# Patient Record
Sex: Male | Born: 1955 | Race: White | Hispanic: No | Marital: Married | Smoking: Never smoker
Health system: Southern US, Community
[De-identification: ages and names within clinical notes are randomized; demographics above are authoritative.]

## PROBLEM LIST (undated history)

## (undated) DIAGNOSIS — I10 Essential (primary) hypertension: Secondary | ICD-10-CM

## (undated) DIAGNOSIS — K219 Gastro-esophageal reflux disease without esophagitis: Secondary | ICD-10-CM

## (undated) DIAGNOSIS — E785 Hyperlipidemia, unspecified: Secondary | ICD-10-CM

## (undated) HISTORY — PX: NO PAST SURGERIES: SHX2092

## (undated) HISTORY — DX: Hyperlipidemia, unspecified: E78.5

## (undated) HISTORY — DX: Gastro-esophageal reflux disease without esophagitis: K21.9

## (undated) HISTORY — DX: Essential (primary) hypertension: I10

---

## 2014-02-15 ENCOUNTER — Ambulatory Visit: Payer: Self-pay | Admitting: Emergency Medicine

## 2014-08-02 ENCOUNTER — Encounter: Payer: Self-pay | Admitting: Family Medicine

## 2014-08-02 DIAGNOSIS — I10 Essential (primary) hypertension: Secondary | ICD-10-CM | POA: Insufficient documentation

## 2014-08-02 DIAGNOSIS — E669 Obesity, unspecified: Secondary | ICD-10-CM | POA: Insufficient documentation

## 2014-08-02 DIAGNOSIS — K219 Gastro-esophageal reflux disease without esophagitis: Secondary | ICD-10-CM | POA: Insufficient documentation

## 2014-12-10 ENCOUNTER — Encounter: Payer: Self-pay | Admitting: Family Medicine

## 2014-12-10 ENCOUNTER — Ambulatory Visit (INDEPENDENT_AMBULATORY_CARE_PROVIDER_SITE_OTHER): Payer: Self-pay | Admitting: Family Medicine

## 2014-12-10 VITALS — BP 120/72 | HR 60 | Ht 70.0 in | Wt 216.6 lb

## 2014-12-10 DIAGNOSIS — K219 Gastro-esophageal reflux disease without esophagitis: Secondary | ICD-10-CM

## 2014-12-10 DIAGNOSIS — E669 Obesity, unspecified: Secondary | ICD-10-CM

## 2014-12-10 DIAGNOSIS — I10 Essential (primary) hypertension: Secondary | ICD-10-CM

## 2014-12-10 NOTE — Progress Notes (Signed)
Date:  12/10/2014   Name:  Adam Coffey   DOB:  1955-09-17   MRN:  [id number]  PCP:  Schuyler Amor, MD    Chief Complaint: Hypertension   History of Present Illness:  This is a 59 y.o. male for f/u HTN, tolerating amlodipine well, no se's noted. Taking Nexium daily for GERD, has sxs when stops but also not watching what he eats on the weekend. Biking now and has lost some weight. Had tetanus imm last visit and not interested in colonoscopy.  Review of Systems:  Review of Systems  Patient Active Problem List   Diagnosis Date Noted  . Gastroesophageal reflux disease 08/02/2014  . Benign essential HTN 08/02/2014  . Obesity (BMI 30.0-34.9) 08/02/2014    Prior to Admission medications   Medication Sig Start Date End Date Taking? Authorizing Provider  amLODipine (NORVASC) 10 MG tablet Take 1 tablet by mouth daily. 05/28/14  Yes Historical Provider, MD  esomeprazole (NEXIUM) 20 MG capsule Take 1 tablet by mouth daily. OTC   Yes Historical Provider, MD    No Known Allergies  No past surgical history on file.  Social History  Substance Use Topics  . Smoking status: Never Smoker   . Smokeless tobacco: Not on file  . Alcohol Use: No    No family history on file.  Medication list has been reviewed and updated.  Physical Examination: BP 120/72 mmHg  Pulse 60  Ht  (1.778 m)  Wt 216 lb 9.6 oz (98.249 kg)  BMI 31.08 kg/m2  Physical Exam  Constitutional: He appears well-developed and well-nourished.  Cardiovascular: Normal rate, regular rhythm and normal heart sounds.   Pulmonary/Chest: Effort normal and breath sounds normal.  Musculoskeletal: He exhibits no edema.    Assessment and Plan:  1. Benign essential HTN Well controlled on current regimen, discussed decreasing amlodipine dose, wants to wait until after holidays  2. Gastroesophageal reflux disease, esophagitis presence not specified Well controlled on daily Nexium, discussed decreasing or stopping  to avoid LT se's, will try just using on the weekends  3. Obesity (BMI 30.0-34.9) Continue increased exercise, weight loss  Return in about 6 months (around 06/12/2015).  [name]. Kingsley Spittle MD [facility]  12/10/2014

## 2015-06-12 ENCOUNTER — Ambulatory Visit: Payer: Self-pay | Admitting: Family Medicine

## 2015-06-13 ENCOUNTER — Encounter: Payer: Self-pay | Admitting: Family Medicine

## 2015-06-13 ENCOUNTER — Ambulatory Visit (INDEPENDENT_AMBULATORY_CARE_PROVIDER_SITE_OTHER): Payer: Self-pay | Admitting: Family Medicine

## 2015-06-13 VITALS — BP 125/85 | HR 77 | Ht 70.0 in | Wt 224.0 lb

## 2015-06-13 DIAGNOSIS — E66811 Obesity, class 1: Secondary | ICD-10-CM

## 2015-06-13 DIAGNOSIS — I10 Essential (primary) hypertension: Secondary | ICD-10-CM

## 2015-06-13 DIAGNOSIS — K219 Gastro-esophageal reflux disease without esophagitis: Secondary | ICD-10-CM

## 2015-06-13 DIAGNOSIS — Z0000 Encounter for general adult medical examination without abnormal findings: Secondary | ICD-10-CM

## 2015-06-13 DIAGNOSIS — E669 Obesity, unspecified: Secondary | ICD-10-CM

## 2015-06-13 DIAGNOSIS — Z8249 Family history of ischemic heart disease and other diseases of the circulatory system: Secondary | ICD-10-CM

## 2015-06-13 MED ORDER — AMLODIPINE BESYLATE 5 MG PO TABS: 5.0000 mg | ORAL_TABLET | Freq: Every day | ORAL | Status: DC

## 2015-06-13 NOTE — Progress Notes (Signed)
Date:  06/13/2015   Name:  Adam Coffey   DOB:  1956-03-12   MRN:  [id number]  PCP:  Schuyler Amor, MD    Chief Complaint: Follow-up and Hypertension   History of Present Illness:  This is a 60 y.o. male for 6 month f/u. Continues to feel well though weight increased over holidays. Unable to wean off Nexium without sign sx recurrence so back to taking daily with good sx control. Exercising daily and interested in lowering amlodipine dose.   Review of Systems:  Review of Systems  Constitutional: Negative for unexpected weight change.  Respiratory: Negative for shortness of breath.   Cardiovascular: Negative for palpitations and leg swelling.  Endocrine: Negative for polyuria.  Genitourinary: Negative for difficulty urinating.  Neurological: Negative for syncope and light-headedness.    Patient Active Problem List   Diagnosis Date Noted  . Gastroesophageal reflux disease 08/02/2014  . Benign essential HTN 08/02/2014  . Obesity (BMI 30.0-34.9) 08/02/2014    Prior to Admission medications   Medication Sig Start Date End Date Taking? Authorizing Provider  amLODipine (NORVASC) 5 MG tablet Take 1 tablet (5 mg total) by mouth daily. 06/13/15  Yes Schuyler Amor, MD  esomeprazole (NEXIUM) 20 MG capsule Take 1 tablet by mouth daily. OTC   Yes Historical Provider, MD    No Known Allergies  Past Surgical History  Procedure Laterality Date  . No past surgeries      Social History  Substance Use Topics  . Smoking status: Never Smoker   . Smokeless tobacco: None  . Alcohol Use: No    Family History  Problem Relation Age of Onset  . Heart disease Father   . Liver cancer Father     Medication list has been reviewed and updated.  Physical Examination: BP 125/85 mmHg  Pulse 77  Ht  (1.778 m)  Wt 224 lb (101.606 kg)  BMI 32.14 kg/m2  Physical Exam  Constitutional: He appears well-developed and well-nourished.  Cardiovascular: Normal rate, regular rhythm and  normal heart sounds.   Pulmonary/Chest: Effort normal and breath sounds normal.  Musculoskeletal: He exhibits no edema.  Neurological: He is alert.  Skin: Skin is warm and dry.  Psychiatric: He has a normal mood and affect. His behavior is normal.  Nursing note and vitals reviewed.   Assessment and Plan:  1. Benign essential HTN Well controlled past two visits (125/85 and 120/72), decrease amlodipine to 5 mg daily  2. Gastroesophageal reflux disease, esophagitis presence not specified Unable to wean off Nexium, continue daily use  3. Obesity (BMI 30.0-34.9) Discussed exercise/weight loss  4. Health maintenance Consider blood work next visit (none since initial visit 03/2014)  Return in about 6 months (around 12/11/2015).  [name]. Kingsley Spittle MD [facility]  06/13/2015 .mmcp

## 2015-09-16 ENCOUNTER — Other Ambulatory Visit: Payer: Self-pay | Admitting: Family Medicine

## 2015-09-17 ENCOUNTER — Telehealth: Payer: Self-pay

## 2015-09-17 NOTE — Telephone Encounter (Signed)
Amlodipine 5mg  refilled 2/23, 90d with 3R, suspect pharm call was for amlodipine 10mg  which he is no longer taking

## 2015-09-17 NOTE — Telephone Encounter (Signed)
Sent to [facility] 

## 2015-12-13 ENCOUNTER — Ambulatory Visit: Payer: Self-pay | Admitting: Family Medicine

## 2016-03-25 ENCOUNTER — Ambulatory Visit (INDEPENDENT_AMBULATORY_CARE_PROVIDER_SITE_OTHER): Payer: Self-pay | Admitting: Family Medicine

## 2016-03-25 ENCOUNTER — Encounter: Payer: Self-pay | Admitting: Family Medicine

## 2016-03-25 VITALS — BP 142/92 | HR 60 | Temp 97.4°F | Ht 70.0 in | Wt 224.0 lb

## 2016-03-25 DIAGNOSIS — K219 Gastro-esophageal reflux disease without esophagitis: Secondary | ICD-10-CM

## 2016-03-25 DIAGNOSIS — E669 Obesity, unspecified: Secondary | ICD-10-CM

## 2016-03-25 DIAGNOSIS — I10 Essential (primary) hypertension: Secondary | ICD-10-CM

## 2016-03-25 DIAGNOSIS — Z8249 Family history of ischemic heart disease and other diseases of the circulatory system: Secondary | ICD-10-CM

## 2016-03-25 DIAGNOSIS — E66811 Obesity, class 1: Secondary | ICD-10-CM

## 2016-03-26 ENCOUNTER — Other Ambulatory Visit: Payer: Self-pay | Admitting: Family Medicine

## 2016-03-26 LAB — COMPREHENSIVE METABOLIC PANEL
ALBUMIN: 4.5 g/dL (ref 3.6–4.8)
ALT: 18 IU/L (ref 0–44)
AST: 19 IU/L (ref 0–40)
Albumin/Globulin Ratio: 1.5 (ref 1.2–2.2)
Alkaline Phosphatase: 64 IU/L (ref 39–117)
BUN / CREAT RATIO: 17 (ref 10–24)
BUN: 20 mg/dL (ref 8–27)
Bilirubin Total: 0.6 mg/dL (ref 0.0–1.2)
CO2: 25 mmol/L (ref 18–29)
CREATININE: 1.15 mg/dL (ref 0.76–1.27)
Calcium: 9.7 mg/dL (ref 8.6–10.2)
Chloride: 101 mmol/L (ref 96–106)
GFR calc non Af Amer: 69 mL/min/{1.73_m2} (ref >59)
GFR, EST AFRICAN AMERICAN: 80 mL/min/{1.73_m2} (ref >59)
GLUCOSE: 92 mg/dL (ref 65–99)
Globulin, Total: 3 g/dL (ref 1.5–4.5)
Potassium: 4.4 mmol/L (ref 3.5–5.2)
Sodium: 141 mmol/L (ref 134–144)
Total Protein: 7.5 g/dL (ref 6.0–8.5)

## 2016-03-26 LAB — LIPID PANEL
CHOL/HDL RATIO: 7.6 ratio — AB (ref 0.0–5.0)
Cholesterol, Total: 266 mg/dL — ABNORMAL HIGH (ref 100–199)
HDL: 35 mg/dL — AB (ref >39)
Triglycerides: 495 mg/dL — ABNORMAL HIGH (ref 0–149)

## 2016-03-26 LAB — CBC
HEMATOCRIT: 42.4 % (ref 37.5–51.0)
Hemoglobin: 14.8 g/dL (ref 13.0–17.7)
MCH: 28.7 pg (ref 26.6–33.0)
MCHC: 34.9 g/dL (ref 31.5–35.7)
MCV: 82 fL (ref 79–97)
Platelets: 254 10*3/uL (ref 150–379)
RBC: 5.16 x10E6/uL (ref 4.14–5.80)
RDW: 14.4 % (ref 12.3–15.4)
WBC: 7.5 10*3/uL (ref 3.4–10.8)

## 2016-03-26 MED ORDER — ATORVASTATIN CALCIUM 20 MG PO TABS: 20.0000 mg | ORAL_TABLET | Freq: Every day | ORAL | 2 refills | Status: DC

## 2016-03-26 NOTE — Progress Notes (Signed)
Date:  03/25/2016   Name:  Adam Coffey   DOB:  December 21, 1955   MRN:  [id number]  PCP:  Schuyler AmorWilliam Jesyca Weisenburger, MD    Chief Complaint: Hypertension (6 months f/u)   History of Present Illness:  This is a 60 y.o. male for ten month f/u, amlodipine decreased to 5 mg daily last visit but refilled 10 mg daily, tolerating well. Still unable to d/c Nexium due to severe recurrent GERD sxs.. Weight unchanged, exercising daily. 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 Declines flu imm.  Review of Systems:  Review of Systems  Constitutional: Negative for appetite change and fever.  Respiratory: Negative for cough and shortness of breath.   Cardiovascular: Negative for chest pain and leg swelling.  Endocrine: Negative for polyuria.  Genitourinary: Negative for difficulty urinating.  Neurological: Negative for dizziness and syncope.    Patient Active Problem List   Diagnosis Date Noted  . FH: heart disease 06/13/2015  . Gastroesophageal reflux disease 08/02/2014  . Benign essential HTN 08/02/2014  . Obesity (BMI 30.0-34.9) 08/02/2014    Prior to Admission medications   Medication Sig Start Date End Date Taking? Authorizing Provider  amLODipine (NORVASC) 10 MG tablet TAKE ONE TABLET BY MOUTH ONCE DAILY 09/19/15   Schuyler AmorWilliam Moataz Tavis, MD  esomeprazole (NEXIUM) 20 MG capsule Take 1 tablet by mouth daily. OTC    Historical Provider, MD    No Known Allergies  Past Surgical History:  Procedure Laterality Date  . NO PAST SURGERIES      Social History  Substance Use Topics  . Smoking status: Never Smoker  . Smokeless tobacco: Not on file  . Alcohol use No    Family History  Problem Relation Age of Onset  . Heart disease Father   . Liver cancer Father     Medication list has been reviewed and updated.  Physical Examination: BP (!) 142/92   Pulse 60   Temp 97.4 F (36.3 C)   Ht 5\' 10"  (1.778 m)   Wt 224 lb (101.6 kg)   SpO2 98%   BMI 32.14 kg/m   Physical Exam   Constitutional: He appears well-developed and well-nourished.  Cardiovascular: Normal rate, regular rhythm and normal heart sounds.   Pulmonary/Chest: Effort normal and breath sounds normal.  Musculoskeletal: He exhibits no edema.  Neurological: He is alert.  Skin: Skin is warm and dry.  Psychiatric: He has a normal mood and affect. His behavior is normal.  Nursing note and vitals reviewed.   Assessment and Plan:  1. Benign essential HTN Marginal control today, continue amlodipine at current dose - Comprehensive Metabolic Panel (CMET) - CBC - Lipid Profile  2. Gastroesophageal reflux disease, esophagitis presence not specified Cont Nexium as unable to d/c, consider B12 level next visit  3. Obesity (BMI 30.0-34.9) Exercise/diet/weight loss discussed  4. FH: heart disease Check lipids  Return in about 6 months (around 09/23/2016).  Dionne AnoWilliam M. Kingsley Coffey, Jr. MD [facility]  03/26/2016

## 2016-07-29 ENCOUNTER — Other Ambulatory Visit: Payer: Self-pay | Admitting: Family Medicine

## 2016-09-23 ENCOUNTER — Encounter: Payer: Self-pay | Admitting: Family Medicine

## 2016-09-23 ENCOUNTER — Ambulatory Visit (INDEPENDENT_AMBULATORY_CARE_PROVIDER_SITE_OTHER): Payer: Self-pay | Admitting: Family Medicine

## 2016-09-23 VITALS — BP 134/82 | HR 71 | Resp 16 | Ht 70.0 in | Wt 211.0 lb

## 2016-09-23 DIAGNOSIS — K219 Gastro-esophageal reflux disease without esophagitis: Secondary | ICD-10-CM

## 2016-09-23 DIAGNOSIS — E669 Obesity, unspecified: Secondary | ICD-10-CM

## 2016-09-23 DIAGNOSIS — I10 Essential (primary) hypertension: Secondary | ICD-10-CM

## 2016-09-23 DIAGNOSIS — E785 Hyperlipidemia, unspecified: Secondary | ICD-10-CM

## 2016-09-24 DIAGNOSIS — E785 Hyperlipidemia, unspecified: Secondary | ICD-10-CM | POA: Insufficient documentation

## 2016-09-24 NOTE — Progress Notes (Signed)
Date:  09/23/2016   Name:  Adam Coffey   DOB:  1955-07-13   MRN:  [id number]  PCP:  Schuyler AmorPlonk, Arrick Dutton, MD    Chief Complaint: Hypertension   History of Present Illness:  This is a 61 y.o. male seen for six month f/u. Tolerating amlodipine well, GERD sxs well controlled on Nexium (unable to stop), never got message to start statin and not interested now.  Review of Systems:  Review of Systems  Constitutional: Negative for chills and fever.  Respiratory: Negative for cough and shortness of breath.   Cardiovascular: Negative for chest pain and leg swelling.  Genitourinary: Negative for difficulty urinating.  Neurological: Negative for syncope and light-headedness.    Patient Active Problem List   Diagnosis Date Noted  . Hyperlipidemia 09/24/2016  . FH: heart disease 06/13/2015  . Gastroesophageal reflux disease 08/02/2014  . Benign essential HTN 08/02/2014  . Obesity (BMI 30.0-34.9) 08/02/2014    Prior to Admission medications   Medication Sig Start Date End Date Taking? Authorizing Provider  amLODipine (NORVASC) 10 MG tablet TAKE ONE TABLET BY MOUTH ONCE DAILY 07/29/16  Yes Laine Giovanetti, Chrissie NoaWilliam, MD  esomeprazole (NEXIUM) 20 MG capsule Take 1 tablet by mouth daily. OTC   Yes [provider]    No Known Allergies  Past Surgical History:  Procedure Laterality Date  . NO PAST SURGERIES      Social History  Substance Use Topics  . Smoking status: Never Smoker  . Smokeless tobacco: Never Used  . Alcohol use No    Family History  Problem Relation Age of Onset  . Heart disease Father   . Liver cancer Father     Medication list has been reviewed and updated.  Physical Examination: BP 134/82   Pulse 71   Resp 16   Ht 5\' 10"  (1.778 m)   Wt 211 lb (95.7 kg)   SpO2 98%   BMI 30.28 kg/m   Physical Exam  Constitutional: He appears well-developed and well-nourished.  Cardiovascular: Normal rate and regular rhythm.   Pulmonary/Chest: Effort normal and  breath sounds normal.  Musculoskeletal: He exhibits no edema.  Neurological: He is alert.  Skin: Skin is warm and dry.  Psychiatric: He has a normal mood and affect. His behavior is normal.  Nursing note and vitals reviewed.   Assessment and Plan:  1. Benign essential HTN Well controlled on amlodipine  2. Gastroesophageal reflux disease, esophagitis presence not specified Well controlled on Nexium, intolerant discontinuation, check B12 level next visit  3. Hyperlipidemia, unspecified hyperlipidemia type Statin/asa recommended but declined (10 yr CVR 18%)  4. Obesity (BMI 30.0-34.9) Exercise/weight loss discussed  Return in about 6 months (around 03/25/2017).  Dionne AnoWilliam M. Kingsley SpittlePlonk, Jr. MD [facility]  09/24/2016

## 2017-03-25 ENCOUNTER — Ambulatory Visit: Payer: Self-pay | Admitting: Family Medicine

## 2017-03-29 ENCOUNTER — Ambulatory Visit: Payer: Self-pay | Admitting: Family Medicine

## 2017-04-07 ENCOUNTER — Ambulatory Visit (INDEPENDENT_AMBULATORY_CARE_PROVIDER_SITE_OTHER): Payer: Self-pay | Admitting: Family Medicine

## 2017-04-07 ENCOUNTER — Encounter: Payer: Self-pay | Admitting: Family Medicine

## 2017-04-07 VITALS — BP 138/84 | HR 52 | Ht 70.0 in | Wt 211.0 lb

## 2017-04-07 DIAGNOSIS — I10 Essential (primary) hypertension: Secondary | ICD-10-CM

## 2017-04-07 DIAGNOSIS — K219 Gastro-esophageal reflux disease without esophagitis: Secondary | ICD-10-CM

## 2017-04-07 DIAGNOSIS — E785 Hyperlipidemia, unspecified: Secondary | ICD-10-CM

## 2017-04-07 DIAGNOSIS — E669 Obesity, unspecified: Secondary | ICD-10-CM

## 2017-04-08 NOTE — Progress Notes (Signed)
Date:  04/07/2017   Name:  Adam LauthJohn Andrew Coffey   DOB:  07/14/1955   MRN:  [id number]  PCP:  Schuyler AmorPlonk, Arvo Ealy, MD    Chief Complaint: Hypertension (Follow up. Declined flu shot.)   History of Present Illness:  This is a 61 y.o. male seen for six month f/u. HTN on amlodipine, tolerating well. GERD on daily OTC Nexium, intolerant taper. Declined statin for HLD. Weight unchanged. Declines flu imm, colonoscopy, labs this visit, last blood work 03/2016.  Review of Systems:  Review of Systems  Constitutional: Negative for chills and fever.  Respiratory: Negative for cough and shortness of breath.   Cardiovascular: Negative for chest pain and leg swelling.  Genitourinary: Negative for difficulty urinating.  Neurological: Negative for syncope and light-headedness.    Patient Active Problem List   Diagnosis Date Noted  . Hyperlipidemia 09/24/2016  . FH: heart disease 06/13/2015  . Gastroesophageal reflux disease 08/02/2014  . Benign essential HTN 08/02/2014  . Obesity (BMI 30.0-34.9) 08/02/2014    Prior to Admission medications   Medication Sig Start Date End Date Taking? Authorizing Provider  amLODipine (NORVASC) 10 MG tablet TAKE ONE TABLET BY MOUTH ONCE DAILY 07/29/16  Yes Michaeal Davis, Chrissie NoaWilliam, MD  esomeprazole (NEXIUM) 20 MG capsule Take 1 tablet by mouth daily. OTC   Yes [provider]    No Known Allergies  Past Surgical History:  Procedure Laterality Date  . NO PAST SURGERIES      Social History   Tobacco Use  . Smoking status: Never Smoker  . Smokeless tobacco: Never Used  Substance Use Topics  . Alcohol use: No    Alcohol/week: 0.0 oz  . Drug use: No    Family History  Problem Relation Age of Onset  . Heart disease Father   . Liver cancer Father     Medication list has been reviewed and updated.  Physical Examination: BP 138/84   Pulse (!) 52   Ht 5\' 10"  (1.778 m)   Wt 211 lb (95.7 kg)   SpO2 96%   BMI 30.28 kg/m   Physical Exam   Constitutional: He appears well-developed and well-nourished.  Cardiovascular: Normal rate, regular rhythm and normal heart sounds.  Pulmonary/Chest: Effort normal and breath sounds normal.  Musculoskeletal: He exhibits no edema.  Neurological: He is alert.  Skin: Skin is warm and dry.  Psychiatric: He has a normal mood and affect. His behavior is normal.  Nursing note and vitals reviewed.   Assessment and Plan:  1. Benign essential HTN Adequate control on amlodipine  2. Gastroesophageal reflux disease, esophagitis presence not specified Well controlled on daily OTC Nexium, intolerant taper, consider GI referral for EGD  3. Hyperlipidemia, unspecified hyperlipidemia type Declines statin  4. Obesity (BMI 30.0-34.9) Weight stable, exercise/weight loss discussed  5. HM Consider hep C, HIV, readdress colonoscopy next visit  Return in about 1 year (around 04/07/2018).  Dionne AnoWilliam M. Kingsley SpittlePlonk, Jr. MD [facility]  04/08/2017

## 2017-04-28 ENCOUNTER — Other Ambulatory Visit: Payer: Self-pay | Admitting: Family Medicine

## 2017-12-08 ENCOUNTER — Other Ambulatory Visit: Payer: Self-pay

## 2018-04-07 ENCOUNTER — Encounter: Payer: Self-pay | Admitting: Internal Medicine

## 2018-04-07 ENCOUNTER — Ambulatory Visit (INDEPENDENT_AMBULATORY_CARE_PROVIDER_SITE_OTHER): Payer: Self-pay | Admitting: Internal Medicine

## 2018-04-07 VITALS — BP 128/74 | HR 70 | Ht 70.0 in | Wt 229.0 lb

## 2018-04-07 DIAGNOSIS — E669 Obesity, unspecified: Secondary | ICD-10-CM

## 2018-04-07 DIAGNOSIS — I10 Essential (primary) hypertension: Secondary | ICD-10-CM

## 2018-04-07 DIAGNOSIS — E782 Mixed hyperlipidemia: Secondary | ICD-10-CM

## 2018-04-07 DIAGNOSIS — K219 Gastro-esophageal reflux disease without esophagitis: Secondary | ICD-10-CM

## 2018-04-07 MED ORDER — AMLODIPINE BESYLATE 10 MG PO TABS: 10.0000 mg | ORAL_TABLET | Freq: Every day | ORAL | 3 refills | Status: DC

## 2018-04-07 NOTE — Patient Instructions (Signed)

## 2018-04-07 NOTE — Progress Notes (Signed)
Date:  04/07/2018   Name:  Adam LauthJohn Andrew Cipollone   DOB:  [birth year]   MRN:  [id number]   Chief Complaint: Establish Care and Hypertension (Refill on Amlodipine. ) Previous patient of Dr. Hollace HaywardPlonk.  He feels well, has no complaints today.  He declines all HM screenings and vaccinations.  He has not had labs in 2 years and declines these today as well.  Hypertension  This is a chronic problem. Episode onset: several years ago. The problem is controlled. Pertinent negatives include no chest pain, headaches or palpitations. Past treatments include calcium channel blockers. The current treatment provides significant improvement. There are no compliance problems.   Gastroesophageal Reflux  He complains of heartburn. He reports no abdominal pain, no chest pain, no choking, no coughing or no wheezing. This is a recurrent problem. The problem occurs rarely. Pertinent negatives include no fatigue. He has tried a PPI (over the counter nexium) for the symptoms. The treatment provided significant relief.    Review of Systems  Constitutional: Negative for chills, fatigue and fever.  HENT: Negative for trouble swallowing.   Eyes: Negative for visual disturbance.  Respiratory: Negative for apnea, cough, choking and wheezing.   Cardiovascular: Negative for chest pain and palpitations.  Gastrointestinal: Positive for heartburn. Negative for abdominal pain, constipation and diarrhea.  Musculoskeletal: Positive for arthralgias (both knees).  Skin: Negative for color change and rash.  Allergic/Immunologic: Negative for environmental allergies.  Neurological: Negative for headaches.  Psychiatric/Behavioral: Negative for dysphoric mood and sleep disturbance.    Patient Active Problem List   Diagnosis Date Noted  . Hyperlipidemia 09/24/2016  . FH: heart disease 06/13/2015  . Gastroesophageal reflux disease 08/02/2014  . Benign essential HTN 08/02/2014  . Obesity (BMI 30.0-34.9) 08/02/2014    No Known  Allergies  Past Surgical History:  Procedure Laterality Date  . NO PAST SURGERIES      Social History   Tobacco Use  . Smoking status: Never Smoker  . Smokeless tobacco: Never Used  Substance Use Topics  . Alcohol use: No    Alcohol/week: 0.0 standard drinks  . Drug use: No     Medication list has been reviewed and updated.  Current Meds  Medication Sig  . amLODipine (NORVASC) 10 MG tablet TAKE 1 TABLET BY MOUTH ONCE DAILY  . esomeprazole (NEXIUM) 20 MG capsule Take 1 tablet by mouth daily. OTC    PHQ 2/9 Scores 04/07/2018 09/23/2016  PHQ - 2 Score 0 0   Wt Readings from Last 3 Encounters:  04/07/18 229 lb (103.9 kg)  04/07/17 211 lb (95.7 kg)  09/23/16 211 lb (95.7 kg)    Physical Exam Vitals signs and nursing note reviewed.  Constitutional:      General: He is not in acute distress.    Appearance: He is well-developed.  HENT:     Head: Normocephalic and atraumatic.     Mouth/Throat:     Mouth: Mucous membranes are moist.  Eyes:     Extraocular Movements: Extraocular movements intact.     Pupils: Pupils are equal, round, and reactive to light.  Neck:     Musculoskeletal: Normal range of motion and neck supple.  Cardiovascular:     Rate and Rhythm: Normal rate and regular rhythm.  No extrasystoles are present.    Pulses: Normal pulses.          Dorsalis pedis pulses are 2+ on the right side and 2+ on the left side.     Heart sounds: Normal  heart sounds.  Pulmonary:     Effort: Pulmonary effort is normal. No respiratory distress.     Breath sounds: Normal breath sounds.  Musculoskeletal:     Right lower leg: No edema.     Left lower leg: No edema.  Skin:    General: Skin is warm and dry.     Findings: No rash.  Neurological:     Mental Status: He is alert and oriented to person, place, and time.  Psychiatric:        Behavior: Behavior normal.        Thought Content: Thought content normal.     BP 128/74 (BP Location: Right Arm, Patient Position:  Sitting, Cuff Size: Normal)   Pulse 70   Ht 5\' 10"  (1.778 m)   Wt 229 lb (103.9 kg)   SpO2 97%   BMI 32.86 kg/m   Assessment and Plan: 1. Benign essential HTN Controlled; pt declines blood tests for renal function - amLODipine (NORVASC) 10 MG tablet; Take 1 tablet (10 mg total) by mouth daily.  Dispense: 90 tablet; Refill: 3  2. Gastroesophageal reflux disease, esophagitis presence not specified Controlled on over the counter nexium  3. Mixed hyperlipidemia Not on statin therapy per pt choice  4. Obesity (BMI 30.0-34.9) Discussed recent weight gain - work on continued exercise and diet changes   Partially dictated using Animal nutritionistDragon software. Any errors are unintentional.  Bari EdwardLaura Berglund, MD [facility]  04/07/2018

## 2018-04-08 ENCOUNTER — Ambulatory Visit: Payer: Self-pay | Admitting: Family Medicine

## 2018-04-08 ENCOUNTER — Ambulatory Visit: Payer: Self-pay | Admitting: Internal Medicine

## 2019-04-10 ENCOUNTER — Ambulatory Visit: Payer: Self-pay | Admitting: Internal Medicine

## 2019-04-26 ENCOUNTER — Encounter: Payer: Self-pay | Admitting: Internal Medicine

## 2019-04-26 ENCOUNTER — Other Ambulatory Visit: Payer: Self-pay

## 2019-04-26 ENCOUNTER — Ambulatory Visit (INDEPENDENT_AMBULATORY_CARE_PROVIDER_SITE_OTHER): Payer: Self-pay | Admitting: Internal Medicine

## 2019-04-26 VITALS — BP 128/84 | HR 76 | Ht 70.0 in | Wt 235.0 lb

## 2019-04-26 DIAGNOSIS — K219 Gastro-esophageal reflux disease without esophagitis: Secondary | ICD-10-CM

## 2019-04-26 DIAGNOSIS — I10 Essential (primary) hypertension: Secondary | ICD-10-CM

## 2019-04-26 MED ORDER — AMLODIPINE BESYLATE 10 MG PO TABS: 10.0000 mg | ORAL_TABLET | Freq: Every day | ORAL | 3 refills | Status: DC

## 2019-04-26 NOTE — Progress Notes (Signed)
Date:  04/26/2019   Name:  Adam Coffey   DOB:  [birth year]   MRN:  [id number]   Chief Complaint: Hypertension  Hypertension This is a chronic problem. The problem is unchanged. The problem is controlled. Pertinent negatives include no chest pain, headaches, palpitations, peripheral edema or shortness of breath. Past treatments include calcium channel blockers. The current treatment provides significant improvement. There are no compliance problems.   He feels well, exercises regularly and has good stamina and no chest pains.  He declines all screenings, immunizations and blood work.  Lab Results  Component Value Date   CREATININE 1.15 03/25/2016   BUN 20 03/25/2016   NA 141 03/25/2016   K 4.4 03/25/2016   CL 101 03/25/2016   CO2 25 03/25/2016   Lab Results  Component Value Date   CHOL 266 (H) 03/25/2016   HDL 35 (L) 03/25/2016   LDLCALC Comment 03/25/2016   TRIG 495 (H) 03/25/2016   CHOLHDL 7.6 (H) 03/25/2016   No results found for: TSH No results found for: HGBA1C   Review of Systems  Constitutional: Negative for diaphoresis, fatigue and fever.  HENT: Negative for trouble swallowing.   Eyes: Negative for visual disturbance.  Respiratory: Negative for cough, chest tightness, shortness of breath and wheezing.   Cardiovascular: Negative for chest pain and palpitations.  Gastrointestinal: Negative for abdominal pain, blood in stool, constipation, nausea and vomiting.  Genitourinary: Positive for frequency (nocturia x 1-2 ). Negative for difficulty urinating and urgency.  Musculoskeletal: Negative for arthralgias, gait problem and joint swelling.  Skin: Negative for rash.  Neurological: Negative for dizziness, light-headedness and headaches.  Psychiatric/Behavioral: Negative for dysphoric mood and sleep disturbance. The patient is not nervous/anxious.     Patient Active Problem List   Diagnosis Date Noted  . Hyperlipidemia 09/24/2016  . FH: heart disease  06/13/2015  . Gastroesophageal reflux disease 08/02/2014  . Benign essential HTN 08/02/2014  . Obesity (BMI 30.0-34.9) 08/02/2014    No Known Allergies  Past Surgical History:  Procedure Laterality Date  . NO PAST SURGERIES      Social History   Tobacco Use  . Smoking status: Never Smoker  . Smokeless tobacco: Never Used  Substance Use Topics  . Alcohol use: No    Alcohol/week: 0.0 standard drinks  . Drug use: No     Medication list has been reviewed and updated.  Current Meds  Medication Sig  . amLODipine (NORVASC) 10 MG tablet Take 1 tablet (10 mg total) by mouth daily.  Marland Kitchen esomeprazole (NEXIUM) 20 MG capsule Take 1 tablet by mouth daily. OTC    PHQ 2/9 Scores 04/26/2019 04/07/2018 09/23/2016  PHQ - 2 Score 0 0 0    BP Readings from Last 3 Encounters:  04/26/19 128/84  04/07/18 128/74  04/07/17 138/84    Physical Exam Vitals and nursing note reviewed.  Constitutional:      General: He is not in acute distress.    Appearance: Normal appearance. He is well-developed.  HENT:     Head: Normocephalic and atraumatic.  Neck:     Vascular: No carotid bruit.  Cardiovascular:     Rate and Rhythm: Normal rate and regular rhythm.     Pulses: Normal pulses.     Heart sounds: No murmur.  Pulmonary:     Effort: Pulmonary effort is normal. No respiratory distress.     Breath sounds: No wheezing or rhonchi.  Musculoskeletal:     Cervical back: Normal range of motion.  Right lower leg: No edema.     Left lower leg: No edema.  Lymphadenopathy:     Cervical: No cervical adenopathy.  Skin:    General: Skin is warm and dry.     Capillary Refill: Capillary refill takes less than 2 seconds.     Findings: No rash.  Neurological:     General: No focal deficit present.     Mental Status: He is alert and oriented to person, place, and time.  Psychiatric:        Behavior: Behavior normal.        Thought Content: Thought content normal.     Wt Readings from Last 3  Encounters:  04/26/19 235 lb (106.6 kg)  04/07/18 229 lb (103.9 kg)  04/07/17 211 lb (95.7 kg)    BP 128/84   Pulse 76   Ht 5\' 10"  (1.778 m)   Wt 235 lb (106.6 kg)   SpO2 97%   BMI 33.72 kg/m   Assessment and Plan: 1. Benign essential HTN Clinically stable exam with well controlled BP.   Tolerating medications, amlodipine 10 mg, without side effects at this time. Pt to continue current regimen and low sodium diet; benefits of regular exercise as able discussed. - amLODipine (NORVASC) 10 MG tablet; Take 1 tablet (10 mg total) by mouth daily.  Dispense: 90 tablet; Refill: 3  2. Gastroesophageal reflux disease, unspecified whether esophagitis present Symptoms well controlled on daily PPI No red flag signs such as weight loss, n/v, melena Will continue Nexium - can reduce to TIW.   Partially dictated using . Any errors are unintentional.  Animal nutritionist, MD [facility]  04/26/2019

## 2020-05-01 ENCOUNTER — Other Ambulatory Visit: Payer: Self-pay

## 2020-05-01 ENCOUNTER — Ambulatory Visit: Payer: Self-pay | Admitting: Internal Medicine

## 2020-05-01 ENCOUNTER — Ambulatory Visit
Admission: RE | Admit: 2020-05-01 | Discharge: 2020-05-01 | Disposition: A | Discharge status: Home / Self Care | Payer: Self-pay | Attending: Internal Medicine | Admitting: Internal Medicine

## 2020-05-01 ENCOUNTER — Ambulatory Visit
Admission: RE | Admit: 2020-05-01 | Discharge: 2020-05-01 | Disposition: A | Discharge status: Home / Self Care | Payer: Self-pay | Source: Ambulatory Visit | Attending: Internal Medicine | Admitting: Internal Medicine

## 2020-05-01 ENCOUNTER — Encounter: Payer: Self-pay | Admitting: Internal Medicine

## 2020-05-01 VITALS — BP 140/88 | HR 60 | Temp 97.5°F | Ht 70.0 in | Wt 239.0 lb

## 2020-05-01 DIAGNOSIS — K219 Gastro-esophageal reflux disease without esophagitis: Secondary | ICD-10-CM

## 2020-05-01 DIAGNOSIS — I10 Essential (primary) hypertension: Secondary | ICD-10-CM

## 2020-05-01 DIAGNOSIS — M5416 Radiculopathy, lumbar region: Secondary | ICD-10-CM

## 2020-05-01 DIAGNOSIS — N529 Male erectile dysfunction, unspecified: Secondary | ICD-10-CM

## 2020-05-01 DIAGNOSIS — E782 Mixed hyperlipidemia: Secondary | ICD-10-CM

## 2020-05-01 MED ORDER — AMLODIPINE BESYLATE 10 MG PO TABS: 10.0000 mg | ORAL_TABLET | Freq: Every day | ORAL | 3 refills | Status: DC

## 2020-05-01 MED ORDER — SILDENAFIL CITRATE 100 MG PO TABS: 100.0000 mg | ORAL_TABLET | Freq: Every day | ORAL | 0 refills | Status: DC | PRN

## 2020-05-01 MED ORDER — PREDNISONE 10 MG PO TABS: 10.0000 mg | ORAL_TABLET | ORAL | 0 refills | Status: AC

## 2020-05-01 NOTE — Progress Notes (Signed)
Date:  05/01/2020   Name:  Adam Coffey   DOB:  [birth year]   MRN:  [id number]   Chief Complaint: Hypertension and Gastroesophageal Reflux  Hypertension This is a chronic problem. The problem is controlled. Pertinent negatives include no chest pain, headaches, palpitations or shortness of breath. Past treatments include calcium channel blockers. The current treatment provides significant improvement. Compliance problems: declines all labs.  unknown -.  Gastroesophageal Reflux He complains of heartburn. He reports no abdominal pain, no chest pain, no coughing or no wheezing. This is a recurrent problem. The problem occurs rarely. Pertinent negatives include no fatigue. He has tried a PPI for the symptoms. The treatment provided significant relief.  Hyperlipidemia This is a chronic problem. The problem is uncontrolled. Pertinent negatives include no chest pain, myalgias or shortness of breath. He is currently on no antihyperlipidemic treatment.  Erectile Dysfunction This is a new problem. The current episode started more than 1 month ago. The problem is unchanged. The nature of his difficulty is maintaining erection and penetration. He reports his erection duration to be 1 to 5 minutes. Irritative symptoms include frequency (sometimes has nocturia). Irritative symptoms do not include urgency. Pertinent negatives include no dysuria, hematuria or hesitancy. Nothing aggravates the symptoms. Past treatments include nothing.  Tingling - in rectal area, genitals and often down the legs, esp to the right foot.  He has minimal back pain.  He exercises on a treadmill and has no back pain or weakness.  He has also noticed some increase in ED.  He started taking supplements about 3 months ago with no benefit.  The tingling is present every day but the severity comes and goes.  Lab Results  Component Value Date   CREATININE 1.15 03/25/2016   BUN 20 03/25/2016   NA 141 03/25/2016   K 4.4  03/25/2016   CL 101 03/25/2016   CO2 25 03/25/2016   Lab Results  Component Value Date   CHOL 266 (H) 03/25/2016   HDL 35 (L) 03/25/2016   LDLCALC Comment 03/25/2016   TRIG 495 (H) 03/25/2016   CHOLHDL 7.6 (H) 03/25/2016   No results found for: TSH No results found for: HGBA1C Lab Results  Component Value Date   WBC 7.5 03/25/2016   HGB 14.8 03/25/2016   HCT 42.4 03/25/2016   MCV 82 03/25/2016   PLT 254 03/25/2016   Lab Results  Component Value Date   ALT 18 03/25/2016   AST 19 03/25/2016   ALKPHOS 64 03/25/2016   BILITOT 0.6 03/25/2016     Review of Systems  Constitutional: Negative for appetite change, fatigue and unexpected weight change.  HENT: Negative for trouble swallowing.   Eyes: Negative for visual disturbance.  Respiratory: Negative for cough, shortness of breath and wheezing.   Cardiovascular: Negative for chest pain, palpitations and leg swelling.  Gastrointestinal: Positive for heartburn. Negative for abdominal pain and blood in stool.  Genitourinary: Positive for frequency (sometimes has nocturia). Negative for difficulty urinating, dysuria, hematuria, hesitancy, scrotal swelling and urgency.       Mild ED  Musculoskeletal: Positive for arthralgias (anterior left hip). Negative for gait problem, joint swelling and myalgias.  Skin: Negative for color change and rash.  Neurological: Positive for numbness. Negative for dizziness, tremors, weakness and headaches.  Psychiatric/Behavioral: Negative for dysphoric mood.    Patient Active Problem List   Diagnosis Date Noted  . Hyperlipidemia 09/24/2016  . FH: heart disease 06/13/2015  . Gastroesophageal reflux disease 08/02/2014  .  Benign essential HTN 08/02/2014  . Obesity (BMI 30.0-34.9) 08/02/2014    No Known Allergies  Past Surgical History:  Procedure Laterality Date  . NO PAST SURGERIES      Social History   Tobacco Use  . Smoking status: Never Smoker  . Smokeless tobacco: Never Used   Vaping Use  . Vaping Use: Never used  Substance Use Topics  . Alcohol use: No    Alcohol/week: 0.0 standard drinks  . Drug use: No     Medication list has been reviewed and updated.  Current Meds  Medication Sig  . amLODipine (NORVASC) 10 MG tablet Take 1 tablet (10 mg total) by mouth daily.  . Cyanocobalamin (VITAMIN B12 PO) Take by mouth daily.  Marland Kitchen esomeprazole (NEXIUM) 20 MG capsule Take 1 tablet by mouth daily. OTC  . Specialty Vitamins Products (ONE-A-DAY PROSTATE PO) Take by mouth daily.    PHQ 2/9 Scores 05/01/2020 04/26/2019 04/07/2018 09/23/2016  PHQ - 2 Score 0 0 0 0  PHQ- 9 Score 0 - - -    GAD 7 : Generalized Anxiety Score 05/01/2020  Nervous, Anxious, on Edge 0  Control/stop worrying 0  Worry too much - different things 0  Trouble relaxing 0  Restless 0  Easily annoyed or irritable 0  Afraid - awful might happen 0  Total GAD 7 Score 0    BP Readings from Last 3 Encounters:  05/01/20 140/88  04/26/19 128/84  04/07/18 128/74    Physical Exam Vitals and nursing note reviewed.  Constitutional:      General: He is not in acute distress.    Appearance: Normal appearance. He is well-developed.  HENT:     Head: Normocephalic and atraumatic.  Neck:     Vascular: No carotid bruit.  Cardiovascular:     Rate and Rhythm: Normal rate and regular rhythm.  No extrasystoles are present.    Pulses:          Carotid pulses are 2+ on the right side and 2+ on the left side.      Popliteal pulses are 1+ on the right side and 1+ on the left side.       Dorsalis pedis pulses are 2+ on the right side and 2+ on the left side.     Heart sounds: No murmur heard.   Pulmonary:     Effort: Pulmonary effort is normal. No respiratory distress.     Breath sounds: No wheezing or rhonchi.  Abdominal:     General: Bowel sounds are normal.     Palpations: Abdomen is soft.     Tenderness: There is no abdominal tenderness.  Musculoskeletal:     Lumbar back: No swelling, spasms or  tenderness. Normal range of motion. Positive right straight leg raise test. Negative left straight leg raise test.     Right hip: Decreased range of motion (to external rotation).     Left hip: Normal.     Right knee: Normal.     Left knee: Normal.     Right lower leg: No edema.     Left lower leg: No edema.  Lymphadenopathy:     Cervical: No cervical adenopathy.  Skin:    General: Skin is warm and dry.     Capillary Refill: Capillary refill takes less than 2 seconds.     Findings: No rash.  Neurological:     General: No focal deficit present.     Mental Status: He is alert and oriented to person,  place, and time.     Sensory: Sensory deficit (decreased to light touch lateral right leg) present.     Motor: Motor function is intact. No weakness, tremor or abnormal muscle tone.     Gait: Gait is intact.     Deep Tendon Reflexes:     Reflex Scores:      Patellar reflexes are 2+ on the right side and 2+ on the left side. Psychiatric:        Mood and Affect: Mood and affect and mood normal.     Wt Readings from Last 3 Encounters:  05/01/20 239 lb (108.4 kg)  04/26/19 235 lb (106.6 kg)  04/07/18 229 lb (103.9 kg)    BP 140/88   Pulse 60   Temp (!) 97.5 F (36.4 C) (Oral)   Ht 5\' 10"  (1.778 m)   Wt 239 lb (108.4 kg)   SpO2 97%   BMI 34.29 kg/m   Assessment and Plan: 1. Benign essential HTN Clinically stable exam withfairly well controlled BP on amlodipine. Tolerating medications without side effects at this time. Pt to continue current regimen and low sodium diet; benefits of regular exercise as able discussed. - amLODipine (NORVASC) 10 MG tablet; Take 1 tablet (10 mg total) by mouth daily.  Dispense: 90 tablet; Refill: 3 - Comprehensive metabolic panel  2. Lumbar nerve root impingement Will try steroid taper; get lumbar films May need to consider MRI/ortho consultation - DG Lumbar Spine Complete; Future - predniSONE (DELTASONE) 10 MG tablet; Take 1 tablet (10 mg  total) by mouth as directed for 6 days. Take 6,5,4,3,2,1 then stop  Dispense: 21 tablet; Refill: 0  3. Gastroesophageal reflux disease, unspecified whether esophagitis present Symptoms well controlled on daily PPI No red flag signs such as weight loss, n/v, melena Will continue otc nexium.  4. Mixed hyperlipidemia Not currently on medication  5. Erectile dysfunction, unspecified erectile dysfunction type Will get testosterone level in AM Viagra for PRN use - precautions given - Testosterone - sildenafil (VIAGRA) 100 MG tablet; Take 1 tablet (100 mg total) by mouth daily as needed for erectile dysfunction.  Dispense: 10 tablet; Refill: 0   Partially dictated using . Any errors are unintentional.  Animal nutritionist, MD [facility]  05/01/2020

## 2020-05-02 ENCOUNTER — Encounter: Payer: Self-pay | Admitting: Internal Medicine

## 2020-05-02 DIAGNOSIS — I700 Atherosclerosis of aorta: Secondary | ICD-10-CM | POA: Insufficient documentation

## 2020-05-03 LAB — COMPREHENSIVE METABOLIC PANEL
ALT: 18 IU/L (ref 0–44)
AST: 21 IU/L (ref 0–40)
Albumin/Globulin Ratio: 1.5 (ref 1.2–2.2)
Albumin: 4.3 g/dL (ref 3.8–4.8)
Alkaline Phosphatase: 63 IU/L (ref 44–121)
BUN/Creatinine Ratio: 15 (ref 10–24)
BUN: 19 mg/dL (ref 8–27)
Bilirubin Total: 0.4 mg/dL (ref 0.0–1.2)
CO2: 21 mmol/L (ref 20–29)
Calcium: 9.8 mg/dL (ref 8.6–10.2)
Chloride: 103 mmol/L (ref 96–106)
Creatinine, Ser: 1.25 mg/dL (ref 0.76–1.27)
GFR calc Af Amer: 70 mL/min/{1.73_m2} (ref >59)
GFR calc non Af Amer: 60 mL/min/{1.73_m2} (ref >59)
Globulin, Total: 2.9 g/dL (ref 1.5–4.5)
Glucose: 108 mg/dL — ABNORMAL HIGH (ref 65–99)
Potassium: 4.3 mmol/L (ref 3.5–5.2)
Sodium: 141 mmol/L (ref 134–144)
Total Protein: 7.2 g/dL (ref 6.0–8.5)

## 2020-05-03 LAB — TESTOSTERONE: Testosterone: 222 ng/dL — ABNORMAL LOW (ref 264–916)

## 2020-05-07 ENCOUNTER — Other Ambulatory Visit: Payer: Self-pay | Admitting: Internal Medicine

## 2020-05-07 DIAGNOSIS — E782 Mixed hyperlipidemia: Secondary | ICD-10-CM

## 2020-05-07 MED ORDER — ATORVASTATIN CALCIUM 10 MG PO TABS: 10.0000 mg | ORAL_TABLET | Freq: Every day | ORAL | 1 refills | Status: DC

## 2020-05-07 NOTE — Progress Notes (Signed)
Pt given xray results per notes of Dr. Judithann Graves on 05/02/20. Pt verbalized understanding. Pt ok to start statin- please send to pharmacy on file.

## 2020-05-07 NOTE — Progress Notes (Signed)
Pt wants to start statin medication.  KP

## 2020-05-14 ENCOUNTER — Telehealth: Payer: Self-pay

## 2020-05-14 ENCOUNTER — Other Ambulatory Visit: Payer: Self-pay

## 2020-05-14 DIAGNOSIS — M5416 Radiculopathy, lumbar region: Secondary | ICD-10-CM

## 2020-05-14 NOTE — Telephone Encounter (Signed)
Please call pt to schedule f/u for BP (mid July).   KP

## 2020-05-14 NOTE — Telephone Encounter (Signed)
Copied from CRM [id number]. Topic: General - Other >> May 14, 2020 10:28 AM Lyn Hollingshead D wrote: PT need an appt to f/up his labs an Xray results / please advise

## 2020-06-03 ENCOUNTER — Encounter: Payer: Self-pay | Admitting: Internal Medicine

## 2020-06-03 ENCOUNTER — Ambulatory Visit (INDEPENDENT_AMBULATORY_CARE_PROVIDER_SITE_OTHER): Payer: Self-pay | Admitting: Internal Medicine

## 2020-06-03 ENCOUNTER — Other Ambulatory Visit: Payer: Self-pay

## 2020-06-03 VITALS — BP 140/72 | HR 68 | Temp 97.8°F | Ht 70.0 in | Wt 243.0 lb

## 2020-06-03 DIAGNOSIS — N529 Male erectile dysfunction, unspecified: Secondary | ICD-10-CM

## 2020-06-03 DIAGNOSIS — I700 Atherosclerosis of aorta: Secondary | ICD-10-CM

## 2020-06-03 DIAGNOSIS — E291 Testicular hypofunction: Secondary | ICD-10-CM

## 2020-06-03 DIAGNOSIS — F5101 Primary insomnia: Secondary | ICD-10-CM

## 2020-06-03 DIAGNOSIS — M5136 Other intervertebral disc degeneration, lumbar region: Secondary | ICD-10-CM | POA: Insufficient documentation

## 2020-06-03 DIAGNOSIS — M51369 Other intervertebral disc degeneration, lumbar region without mention of lumbar back pain or lower extremity pain: Secondary | ICD-10-CM | POA: Insufficient documentation

## 2020-06-03 MED ORDER — TEMAZEPAM 15 MG PO CAPS: 15.0000 mg | ORAL_CAPSULE | Freq: Every evening | ORAL | 0 refills | Status: DC | PRN

## 2020-06-03 MED ORDER — TADALAFIL 20 MG PO TABS: 20.0000 mg | ORAL_TABLET | ORAL | 0 refills | Status: DC | PRN

## 2020-06-03 NOTE — Progress Notes (Signed)
Date:  06/03/2020   Name:  Adam Coffey   DOB:  1955/05/10   MRN:  [id number]   Chief Complaint: Insomnia and Tingling (Below waist to feet )  Insomnia Primary symptoms: sleep disturbance, difficulty falling asleep, frequent awakening.  The problem occurs intermittently (usually with travel). The problem is unchanged.  Erectile Dysfunction This is a chronic problem. The problem is unchanged. The nature of his difficulty is achieving erection and maintaining erection. He reports no anxiety. Pertinent negatives include no chills. Past treatments include sildenafil. The treatment provided no relief.  Low Testosterone - levels done last year were low.  He is interested in pursuing treatment.  He has decreased energy and stamina in addition to the ED.  No depression.  No hx of prostate enlargement.  Lab Results  Component Value Date   CREATININE 1.25 05/02/2020   BUN 19 05/02/2020   NA 141 05/02/2020   K 4.3 05/02/2020   CL 103 05/02/2020   CO2 21 05/02/2020   Lab Results  Component Value Date   CHOL 266 (H) 03/25/2016   HDL 35 (L) 03/25/2016   LDLCALC Comment 03/25/2016   TRIG 495 (H) 03/25/2016   CHOLHDL 7.6 (H) 03/25/2016   No results found for: TSH No results found for: HGBA1C Lab Results  Component Value Date   WBC 7.5 03/25/2016   HGB 14.8 03/25/2016   HCT 42.4 03/25/2016   MCV 82 03/25/2016   PLT 254 03/25/2016   Lab Results  Component Value Date   ALT 18 05/02/2020   AST 21 05/02/2020   ALKPHOS 63 05/02/2020   BILITOT 0.4 05/02/2020     Review of Systems  Constitutional: Negative for chills, fatigue and fever.  Respiratory: Negative for chest tightness and shortness of breath.   Cardiovascular: Negative for chest pain.  Genitourinary:       ED  Musculoskeletal: Positive for back pain.  Neurological: Negative for dizziness, light-headedness and headaches.  Psychiatric/Behavioral: Positive for sleep disturbance. Negative for dysphoric mood. The  patient has insomnia. The patient is not nervous/anxious.     Patient Active Problem List   Diagnosis Date Noted  . DDD (degenerative disc disease), lumbar 06/03/2020  . Aortic atherosclerosis (HCC) 05/02/2020  . Hyperlipidemia 09/24/2016  . Gastroesophageal reflux disease 08/02/2014  . Benign essential HTN 08/02/2014  . Obesity (BMI 30.0-34.9) 08/02/2014    No Known Allergies  Past Surgical History:  Procedure Laterality Date  . NO PAST SURGERIES      Social History   Tobacco Use  . Smoking status: Never Smoker  . Smokeless tobacco: Never Used  Vaping Use  . Vaping Use: Never used  Substance Use Topics  . Alcohol use: No    Alcohol/week: 0.0 standard drinks  . Drug use: No     Medication list has been reviewed and updated.  Current Meds  Medication Sig  . amLODipine (NORVASC) 10 MG tablet Take 1 tablet (10 mg total) by mouth daily.  Marland Kitchen atorvastatin (LIPITOR) 10 MG tablet Take 1 tablet (10 mg total) by mouth daily.  . Cyanocobalamin (VITAMIN B12 PO) Take by mouth daily.  Marland Kitchen esomeprazole (NEXIUM) 20 MG capsule Take 1 tablet by mouth daily. OTC  . sildenafil (VIAGRA) 100 MG tablet Take 1 tablet (100 mg total) by mouth daily as needed for erectile dysfunction.    PHQ 2/9 Scores 06/03/2020 05/01/2020 04/26/2019 04/07/2018  PHQ - 2 Score 0 0 0 0  PHQ- 9 Score 4 0 - -  GAD 7 : Generalized Anxiety Score 06/03/2020 05/01/2020  Nervous, Anxious, on Edge 0 0  Control/stop worrying 0 0  Worry too much - different things 0 0  Trouble relaxing 0 0  Restless 0 0  Easily annoyed or irritable 0 0  Afraid - awful might happen 0 0  Total GAD 7 Score 0 0    BP Readings from Last 3 Encounters:  06/03/20 140/72  05/01/20 140/88  04/26/19 128/84    Physical Exam Vitals and nursing note reviewed.  Constitutional:      General: He is not in acute distress.    Appearance: Normal appearance. He is well-developed.  HENT:     Head: Normocephalic and atraumatic.   Cardiovascular:     Rate and Rhythm: Normal rate and regular rhythm.  Pulmonary:     Effort: Pulmonary effort is normal. No respiratory distress.     Breath sounds: No wheezing or rhonchi.  Musculoskeletal:     Cervical back: Normal range of motion.     Right lower leg: No edema.     Left lower leg: No edema.  Lymphadenopathy:     Cervical: No cervical adenopathy.  Skin:    General: Skin is warm and dry.     Findings: No rash.  Neurological:     General: No focal deficit present.     Mental Status: He is alert and oriented to person, place, and time.  Psychiatric:        Mood and Affect: Mood and affect and mood normal.        Behavior: Behavior normal.     Wt Readings from Last 3 Encounters:  06/03/20 243 lb (110.2 kg)  05/01/20 239 lb (108.4 kg)  04/26/19 235 lb (106.6 kg)    BP 140/72   Pulse 68   Temp 97.8 F (36.6 C) (Oral)   Ht 5\' 10"  (1.778 m)   Wt 243 lb (110.2 kg)   SpO2 97%   BMI 34.87 kg/m   Assessment and Plan: 1. Primary insomnia Use temazepam PRN - temazepam (RESTORIL) 15 MG capsule; Take 1 capsule (15 mg total) by mouth at bedtime as needed for sleep.  Dispense: 30 capsule; Refill: 0  2. Hypogonadism in male Will obtain additional labs then begin therapy with topical testosterone if normal - CBC with Differential/Platelet - PSA  3. Erectile dysfunction, unspecified erectile dysfunction type Trial of cialis - tadalafil (CIALIS) 20 MG tablet; Take 1 tablet (20 mg total) by mouth every other day as needed for erectile dysfunction.  Dispense: 10 tablet; Refill: 0  4. Aortic atherosclerosis (HCC) On statin therapy   Partially dictated using . Any errors are unintentional.  Animal nutritionist, MD [facility]  06/03/2020   Partially dictated using Dragon software. Any errors are unintentional.  06/05/2020, MD [facility]  06/03/2020

## 2020-06-04 ENCOUNTER — Other Ambulatory Visit: Payer: Self-pay

## 2020-06-04 DIAGNOSIS — R972 Elevated prostate specific antigen [PSA]: Secondary | ICD-10-CM

## 2020-06-04 LAB — CBC WITH DIFFERENTIAL/PLATELET
Basophils Absolute: 0.1 10*3/uL (ref 0.0–0.2)
Basos: 1 %
EOS (ABSOLUTE): 0.2 10*3/uL (ref 0.0–0.4)
Eos: 3 %
Hematocrit: 45.3 % (ref 37.5–51.0)
Hemoglobin: 15.5 g/dL (ref 13.0–17.7)
Immature Grans (Abs): 0 10*3/uL (ref 0.0–0.1)
Immature Granulocytes: 0 %
Lymphocytes Absolute: 1.8 10*3/uL (ref 0.7–3.1)
Lymphs: 25 %
MCH: 29.3 pg (ref 26.6–33.0)
MCHC: 34.2 g/dL (ref 31.5–35.7)
MCV: 86 fL (ref 79–97)
Monocytes Absolute: 0.9 10*3/uL (ref 0.1–0.9)
Monocytes: 13 %
Neutrophils Absolute: 4 10*3/uL (ref 1.4–7.0)
Neutrophils: 58 %
Platelets: 252 10*3/uL (ref 150–450)
RBC: 5.29 x10E6/uL (ref 4.14–5.80)
RDW: 14.2 % (ref 11.6–15.4)
WBC: 6.9 10*3/uL (ref 3.4–10.8)

## 2020-06-04 LAB — PSA: Prostate Specific Ag, Serum: 8.2 ng/mL — ABNORMAL HIGH (ref 0.0–4.0)

## 2020-09-18 ENCOUNTER — Encounter: Payer: Self-pay | Admitting: Internal Medicine

## 2020-09-18 ENCOUNTER — Ambulatory Visit (INDEPENDENT_AMBULATORY_CARE_PROVIDER_SITE_OTHER): Payer: Self-pay | Admitting: Internal Medicine

## 2020-09-18 ENCOUNTER — Other Ambulatory Visit: Payer: Self-pay

## 2020-09-18 ENCOUNTER — Telehealth: Payer: Self-pay

## 2020-09-18 VITALS — BP 104/70 | HR 84 | Ht 70.0 in | Wt 236.0 lb

## 2020-09-18 DIAGNOSIS — E782 Mixed hyperlipidemia: Secondary | ICD-10-CM

## 2020-09-18 DIAGNOSIS — R972 Elevated prostate specific antigen [PSA]: Secondary | ICD-10-CM

## 2020-09-18 DIAGNOSIS — N529 Male erectile dysfunction, unspecified: Secondary | ICD-10-CM

## 2020-09-18 DIAGNOSIS — I10 Essential (primary) hypertension: Secondary | ICD-10-CM

## 2020-09-18 MED ORDER — ATORVASTATIN CALCIUM 10 MG PO TABS: 10.0000 mg | ORAL_TABLET | Freq: Every day | ORAL | 1 refills | Status: DC

## 2020-09-18 NOTE — Progress Notes (Signed)
Date:  09/18/2020   Name:  Adam Coffey   DOB:  Aug 15, 1955   MRN:  [id number]   Chief Complaint: Hypertension and Elevated PSA  Hypertension This is a chronic problem. The problem has been gradually improving since onset. The problem is controlled. Pertinent negatives include no anxiety, chest pain, headaches, palpitations, peripheral edema or shortness of breath. Past treatments include calcium channel blockers and lifestyle changes. The current treatment provides significant improvement. There are no compliance problems.   Elevated PSA - noted on last last to be 8.  He declines referral to Urology.  As a result I am not willing to prescribe testosterone therapy.  We discussed the need to make a diagnosis and perhaps address issues before the are too advanced.  Lab Results  Component Value Date   CREATININE 1.25 05/02/2020   BUN 19 05/02/2020   NA 141 05/02/2020   K 4.3 05/02/2020   CL 103 05/02/2020   CO2 21 05/02/2020   Lab Results  Component Value Date   CHOL 266 (H) 03/25/2016   HDL 35 (L) 03/25/2016   LDLCALC Comment 03/25/2016   TRIG 495 (H) 03/25/2016   CHOLHDL 7.6 (H) 03/25/2016   No results found for: TSH No results found for: HGBA1C Lab Results  Component Value Date   WBC 6.9 06/03/2020   HGB 15.5 06/03/2020   HCT 45.3 06/03/2020   MCV 86 06/03/2020   PLT 252 06/03/2020   Lab Results  Component Value Date   ALT 18 05/02/2020   AST 21 05/02/2020   ALKPHOS 63 05/02/2020   BILITOT 0.4 05/02/2020     Review of Systems  Constitutional: Negative for chills, fatigue, fever and unexpected weight change.  HENT: Negative for trouble swallowing.   Respiratory: Negative for chest tightness and shortness of breath.   Cardiovascular: Negative for chest pain and palpitations.  Gastrointestinal: Positive for constipation. Negative for abdominal pain, diarrhea and vomiting.  Musculoskeletal: Negative for arthralgias, gait problem and joint swelling.   Neurological: Negative for dizziness, light-headedness, numbness and headaches.  Psychiatric/Behavioral: Negative for dysphoric mood and sleep disturbance. The patient is not nervous/anxious.     Patient Active Problem List   Diagnosis Date Noted  . Erectile dysfunction 09/18/2020  . Elevated PSA, less than 10 ng/ml 09/18/2020  . DDD (degenerative disc disease), lumbar 06/03/2020  . Aortic atherosclerosis (HCC) 05/02/2020  . Hyperlipidemia 09/24/2016  . Gastroesophageal reflux disease 08/02/2014  . Benign essential HTN 08/02/2014  . Obesity (BMI 30.0-34.9) 08/02/2014    No Known Allergies  Past Surgical History:  Procedure Laterality Date  . NO PAST SURGERIES      Social History   Tobacco Use  . Smoking status: Never Smoker  . Smokeless tobacco: Never Used  Vaping Use  . Vaping Use: Never used  Substance Use Topics  . Alcohol use: No    Alcohol/week: 0.0 standard drinks  . Drug use: No     Medication list has been reviewed and updated.  Current Meds  Medication Sig  . amLODipine (NORVASC) 10 MG tablet Take 1 tablet (10 mg total) by mouth daily.  Marland Kitchen atorvastatin (LIPITOR) 10 MG tablet Take 1 tablet (10 mg total) by mouth daily.  Marland Kitchen esomeprazole (NEXIUM) 20 MG capsule Take 1 tablet by mouth daily. OTC    PHQ 2/9 Scores 09/18/2020 06/03/2020 05/01/2020 04/26/2019  PHQ - 2 Score 0 0 0 0  PHQ- 9 Score 2 4 0 -    GAD 7 : Generalized Anxiety  Score 09/18/2020 06/03/2020 05/01/2020  Nervous, Anxious, on Edge 0 0 0  Control/stop worrying 0 0 0  Worry too much - different things 0 0 0  Trouble relaxing 0 0 0  Restless 0 0 0  Easily annoyed or irritable 1 0 0  Afraid - awful might happen 0 0 0  Total GAD 7 Score 1 0 0  Anxiety Difficulty Not difficult at all - -    BP Readings from Last 3 Encounters:  09/18/20 104/70  06/03/20 140/72  05/01/20 140/88    Physical Exam Vitals and nursing note reviewed.  Constitutional:      General: He is not in acute distress.     Appearance: He is well-developed.  HENT:     Head: Normocephalic and atraumatic.  Cardiovascular:     Rate and Rhythm: Normal rate and regular rhythm. Occasional extrasystoles are present.    Pulses: Normal pulses.     Heart sounds: Normal heart sounds. No murmur heard.   Pulmonary:     Effort: Pulmonary effort is normal. No respiratory distress.     Breath sounds: Normal breath sounds.  Musculoskeletal:     Cervical back: Normal range of motion.     Right lower leg: No edema.     Left lower leg: No edema.  Skin:    General: Skin is warm and dry.     Capillary Refill: Capillary refill takes less than 2 seconds.     Findings: No rash.  Neurological:     General: No focal deficit present.     Mental Status: He is alert and oriented to person, place, and time.  Psychiatric:        Attention and Perception: Attention and perception normal.        Mood and Affect: Mood normal.        Behavior: Behavior normal.     Wt Readings from Last 3 Encounters:  09/18/20 236 lb (107 kg)  06/03/20 243 lb (110.2 kg)  05/01/20 239 lb (108.4 kg)    BP 104/70 (BP Location: Right Arm, Patient Position: Sitting, Cuff Size: Large)   Pulse 84   Ht 5\' 10"  (1.778 m)   Wt 236 lb (107 kg)   SpO2 96%   BMI 33.86 kg/m   Assessment and Plan: 1. Benign essential HTN Clinically stable exam with well controlled BP. Tolerating medications without side effects at this time. Pt to continue current regimen and low sodium diet; continue regular exercise as able discussed.  2. Elevated PSA, less than 10 ng/ml Patient declines Urology evaluation at this time. He may reconsider once he is retired and/or gets medicare coverage  3. Erectile dysfunction, unspecified erectile dysfunction type Cialis was not effective  4. Mixed hyperlipidemia Doing well on Lipitor. - atorvastatin (LIPITOR) 10 MG tablet; Take 1 tablet (10 mg total) by mouth daily.  Dispense: 90 tablet; Refill: 1   Partially dictated  using . Any errors are unintentional.  Animal nutritionist, MD [facility]  09/18/2020

## 2020-09-18 NOTE — Telephone Encounter (Signed)
Called and spoke with patient about appointment scheduled this afternoon. Told him Dr Judithann Graves wanted me to call him to let him know we can repeat a testosterone level but if its still abnormal she is not comfortable with prescribing him HRT meds because his PSA was 8 at his last check. Explained he really needs to see a urologist for this.  Patient said He is "not going to see a urologist, so should I keep this appointment?"  Told him to keep the appt so we can follow up on his blood pressure, and we can do labs if needed but just made sure he understands Dr Judithann Graves will not be prescribing HRT.

## 2021-04-29 ENCOUNTER — Other Ambulatory Visit: Payer: Self-pay

## 2021-04-29 ENCOUNTER — Ambulatory Visit (INDEPENDENT_AMBULATORY_CARE_PROVIDER_SITE_OTHER): Payer: Medicare Other | Admitting: Internal Medicine

## 2021-04-29 ENCOUNTER — Encounter: Payer: Self-pay | Admitting: Internal Medicine

## 2021-04-29 VITALS — BP 118/64 | HR 86 | Ht 70.0 in | Wt 238.0 lb

## 2021-04-29 DIAGNOSIS — I10 Essential (primary) hypertension: Secondary | ICD-10-CM

## 2021-04-29 DIAGNOSIS — Z23 Encounter for immunization: Secondary | ICD-10-CM | POA: Diagnosis not present

## 2021-04-29 DIAGNOSIS — Z6834 Body mass index (BMI) 34.0-34.9, adult: Secondary | ICD-10-CM

## 2021-04-29 DIAGNOSIS — E782 Mixed hyperlipidemia: Secondary | ICD-10-CM

## 2021-04-29 DIAGNOSIS — R972 Elevated prostate specific antigen [PSA]: Secondary | ICD-10-CM | POA: Diagnosis not present

## 2021-04-29 DIAGNOSIS — I700 Atherosclerosis of aorta: Secondary | ICD-10-CM

## 2021-04-29 DIAGNOSIS — Z1159 Encounter for screening for other viral diseases: Secondary | ICD-10-CM

## 2021-04-29 MED ORDER — AMLODIPINE BESYLATE 10 MG PO TABS: 10.0000 mg | ORAL_TABLET | Freq: Every day | ORAL | 1 refills | Status: AC

## 2021-04-29 MED ORDER — ATORVASTATIN CALCIUM 10 MG PO TABS: 10.0000 mg | ORAL_TABLET | Freq: Every day | ORAL | 1 refills | Status: AC

## 2021-04-29 NOTE — Progress Notes (Signed)
Date:  04/29/2021   Name:  Adam Coffey   DOB:  October 18, 1955   MRN:  [id number]   Chief Complaint: Hypertension  Hypertension This is a chronic problem. The problem is controlled. Pertinent negatives include no chest pain, headaches, palpitations or shortness of breath. Past treatments include calcium channel blockers. The current treatment provides significant improvement. There is no history of kidney disease, CAD/MI or CVA.  Hyperlipidemia This is a chronic problem. Pertinent negatives include no chest pain or shortness of breath. Current antihyperlipidemic treatment includes statins. Improvement on treatment: started last year but no follow up.  PSA - elevated last year 8.2.  He was referred to Urology but declined the appointment when he was called.  He now has Medicare but no Part D and would like to be referred again.  He has some penile numbness with intercourse and is concerned about possible low testosterone. He is working on weight loss with portion control and exercise.  He is interested in weight loss medication, specifically Wegovy.   Lab Results  Component Value Date   PSA1 8.2 (H) 06/03/2020   Lab Results  Component Value Date   NA 141 05/02/2020   K 4.3 05/02/2020   CO2 21 05/02/2020   GLUCOSE 108 (H) 05/02/2020   BUN 19 05/02/2020   CREATININE 1.25 05/02/2020   CALCIUM 9.8 05/02/2020   GFRNONAA 60 05/02/2020   Lab Results  Component Value Date   CHOL 266 (H) 03/25/2016   HDL 35 (L) 03/25/2016   LDLCALC Comment 03/25/2016   TRIG 495 (H) 03/25/2016   CHOLHDL 7.6 (H) 03/25/2016   No results found for: TSH No results found for: HGBA1C Lab Results  Component Value Date   WBC 6.9 06/03/2020   HGB 15.5 06/03/2020   HCT 45.3 06/03/2020   MCV 86 06/03/2020   PLT 252 06/03/2020   Lab Results  Component Value Date   ALT 18 05/02/2020   AST 21 05/02/2020   ALKPHOS 63 05/02/2020   BILITOT 0.4 05/02/2020   No results found for: 25OHVITD2,  25OHVITD3, VD25OH   Review of Systems  Constitutional:  Negative for chills, fatigue and unexpected weight change (has lost 8 lbs recently with diet/exercise).  HENT:  Negative for nosebleeds.   Eyes:  Negative for visual disturbance.  Respiratory:  Negative for cough, chest tightness, shortness of breath and wheezing.   Cardiovascular:  Negative for chest pain, palpitations and leg swelling.  Gastrointestinal:  Negative for abdominal pain, constipation and diarrhea.  Genitourinary:  Negative for difficulty urinating, hematuria and penile discharge.  Musculoskeletal:  Negative for arthralgias and gait problem.  Neurological:  Positive for numbness (intermittent tingling in feet is improving). Negative for dizziness, weakness, light-headedness and headaches.  Psychiatric/Behavioral:  Negative for dysphoric mood and sleep disturbance. The patient is not nervous/anxious.    Patient Active Problem List   Diagnosis Date Noted   Erectile dysfunction 09/18/2020   Elevated PSA, less than 10 ng/ml 09/18/2020   DDD (degenerative disc disease), lumbar 06/03/2020   Aortic atherosclerosis ([place]) 05/02/2020   Hyperlipidemia 09/24/2016   Gastroesophageal reflux disease 08/02/2014   Benign essential HTN 08/02/2014   Obesity (BMI 30.0-34.9) 08/02/2014    No Known Allergies  Past Surgical History:  Procedure Laterality Date   NO PAST SURGERIES      Social History   Tobacco Use   Smoking status: Never   Smokeless tobacco: Never  Vaping Use   Vaping Use: Never used  Substance Use Topics  Alcohol use: No    Alcohol/week: 0.0 standard drinks   Drug use: No     Medication list has been reviewed and updated.  Current Meds  Medication Sig   amLODipine (NORVASC) 10 MG tablet Take 1 tablet (10 mg total) by mouth daily.   atorvastatin (LIPITOR) 10 MG tablet Take 1 tablet (10 mg total) by mouth daily.   esomeprazole (NEXIUM) 20 MG capsule Take 1 tablet by mouth daily. OTC    PHQ 2/9 Scores  04/29/2021 09/18/2020 06/03/2020 05/01/2020  PHQ - 2 Score 0 0 0 0  PHQ- 9 Score 2 2 4  0    GAD 7 : Generalized Anxiety Score 04/29/2021 09/18/2020 06/03/2020 05/01/2020  Nervous, Anxious, on Edge 0 0 0 0  Control/stop worrying 0 0 0 0  Worry too much - different things 0 0 0 0  Trouble relaxing 0 0 0 0  Restless 0 0 0 0  Easily annoyed or irritable 0 1 0 0  Afraid - awful might happen 0 0 0 0  Total GAD 7 Score 0 1 0 0  Anxiety Difficulty Not difficult at all Not difficult at all - -    BP Readings from Last 3 Encounters:  04/29/21 118/64  09/18/20 104/70  06/03/20 140/72    Physical Exam Vitals and nursing note reviewed.  Constitutional:      General: He is not in acute distress.    Appearance: He is well-developed.  HENT:     Head: Normocephalic and atraumatic.  Neck:     Vascular: No carotid bruit.  Cardiovascular:     Rate and Rhythm: Normal rate and regular rhythm.     Pulses: Normal pulses.  Pulmonary:     Effort: Pulmonary effort is normal. No respiratory distress.     Breath sounds: No wheezing or rhonchi.  Musculoskeletal:     Cervical back: Normal range of motion and neck supple.     Right lower leg: No edema.     Left lower leg: No edema.  Lymphadenopathy:     Cervical: No cervical adenopathy.  Skin:    General: Skin is warm and dry.     Capillary Refill: Capillary refill takes less than 2 seconds.     Findings: No rash.  Neurological:     General: No focal deficit present.     Mental Status: He is alert and oriented to person, place, and time.  Psychiatric:        Mood and Affect: Mood normal.        Behavior: Behavior normal.    Wt Readings from Last 3 Encounters:  04/29/21 238 lb (108 kg)  09/18/20 236 lb (107 kg)  06/03/20 243 lb (110.2 kg)    BP 118/64    Pulse 86    Ht 5\' 10"  (1.778 m)    Wt 238 lb (108 kg)    SpO2 98%    BMI 34.15 kg/m   Assessment and Plan: 1. Benign essential HTN Clinically stable exam with well controlled BP. Tolerating  medications without side effects at this time. Pt to continue current regimen and low sodium diet; continue regular exercise. - Comprehensive metabolic panel - CBC with Differential/Platelet - amLODipine (NORVASC) 10 MG tablet; Take 1 tablet (10 mg total) by mouth daily.  Dispense: 90 tablet; Refill: 1  2. Mixed hyperlipidemia Tolerating statin medication - will check labs - Lipid panel - atorvastatin (LIPITOR) 10 MG tablet; Take 1 tablet (10 mg total) by mouth daily.  Dispense:  90 tablet; Refill: 1  3. Elevated PSA, less than 10 ng/ml Repeat today and refer to Urology if still elevated May need to go anyway for his complaint of penile numbness - PSA  4. BMI 34.0-34.9,adult Advised that Mancel Parsons is very expensive and other cost effective medications (phentermine) are contraindicated with HTN and age over 5.  5. Aortic atherosclerosis ([place]) On statin.  6. Need for hepatitis C screening test - Hepatitis C antibody  7. Need for vaccination for pneumococcus - Pneumococcal conjugate vaccine 20-valent   Partially dictated using Editor, commissioning. Any errors are unintentional.  Halina Maidens, MD [facility]  04/29/2021

## 2021-04-30 ENCOUNTER — Other Ambulatory Visit: Payer: Self-pay | Admitting: Internal Medicine

## 2021-04-30 DIAGNOSIS — R972 Elevated prostate specific antigen [PSA]: Secondary | ICD-10-CM

## 2021-04-30 LAB — CBC WITH DIFFERENTIAL/PLATELET
Basophils Absolute: 0.1 10*3/uL (ref 0.0–0.2)
Basos: 1 %
EOS (ABSOLUTE): 0.1 10*3/uL (ref 0.0–0.4)
Eos: 2 %
Hematocrit: 41.4 % (ref 37.5–51.0)
Hemoglobin: 14.7 g/dL (ref 13.0–17.7)
Immature Grans (Abs): 0 10*3/uL (ref 0.0–0.1)
Immature Granulocytes: 0 %
Lymphocytes Absolute: 1.6 10*3/uL (ref 0.7–3.1)
Lymphs: 20 %
MCH: 29.6 pg (ref 26.6–33.0)
MCHC: 35.5 g/dL (ref 31.5–35.7)
MCV: 83 fL (ref 79–97)
Monocytes Absolute: 0.8 10*3/uL (ref 0.1–0.9)
Monocytes: 10 %
Neutrophils Absolute: 5.5 10*3/uL (ref 1.4–7.0)
Neutrophils: 67 %
Platelets: 254 10*3/uL (ref 150–450)
RBC: 4.97 x10E6/uL (ref 4.14–5.80)
RDW: 13.1 % (ref 11.6–15.4)
WBC: 8 10*3/uL (ref 3.4–10.8)

## 2021-04-30 LAB — COMPREHENSIVE METABOLIC PANEL
ALT: 27 IU/L (ref 0–44)
AST: 40 IU/L (ref 0–40)
Albumin/Globulin Ratio: 1.5 (ref 1.2–2.2)
Albumin: 4.4 g/dL (ref 3.8–4.8)
Alkaline Phosphatase: 76 IU/L (ref 44–121)
BUN/Creatinine Ratio: 17 (ref 10–24)
BUN: 21 mg/dL (ref 8–27)
Bilirubin Total: 0.6 mg/dL (ref 0.0–1.2)
CO2: 23 mmol/L (ref 20–29)
Calcium: 9.7 mg/dL (ref 8.6–10.2)
Chloride: 103 mmol/L (ref 96–106)
Creatinine, Ser: 1.23 mg/dL (ref 0.76–1.27)
Globulin, Total: 2.9 g/dL (ref 1.5–4.5)
Glucose: 92 mg/dL (ref 70–99)
Potassium: 4.7 mmol/L (ref 3.5–5.2)
Sodium: 141 mmol/L (ref 134–144)
Total Protein: 7.3 g/dL (ref 6.0–8.5)
eGFR: 65 mL/min/{1.73_m2} (ref >59)

## 2021-04-30 LAB — LIPID PANEL
Chol/HDL Ratio: 4.7 ratio (ref 0.0–5.0)
Cholesterol, Total: 196 mg/dL (ref 100–199)
HDL: 42 mg/dL (ref >39)
LDL Chol Calc (NIH): 125 mg/dL — ABNORMAL HIGH (ref 0–99)
Triglycerides: 165 mg/dL — ABNORMAL HIGH (ref 0–149)
VLDL Cholesterol Cal: 29 mg/dL (ref 5–40)

## 2021-04-30 LAB — HEPATITIS C ANTIBODY: Hep C Virus Ab: <0.1 s/co ratio (ref 0.0–0.9)

## 2021-04-30 LAB — PSA: Prostate Specific Ag, Serum: 10.5 ng/mL — ABNORMAL HIGH (ref 0.0–4.0)

## 2021-05-01 ENCOUNTER — Other Ambulatory Visit: Payer: Self-pay

## 2021-05-01 NOTE — Progress Notes (Unsigned)
error 

## 2021-05-13 ENCOUNTER — Encounter: Payer: Self-pay | Admitting: Urology

## 2021-05-13 ENCOUNTER — Ambulatory Visit (INDEPENDENT_AMBULATORY_CARE_PROVIDER_SITE_OTHER): Payer: Medicare Other | Admitting: Urology

## 2021-05-13 ENCOUNTER — Other Ambulatory Visit: Payer: Self-pay

## 2021-05-13 VITALS — BP 150/84 | HR 80 | Ht 70.0 in | Wt 235.0 lb

## 2021-05-13 DIAGNOSIS — R972 Elevated prostate specific antigen [PSA]: Secondary | ICD-10-CM

## 2021-05-13 DIAGNOSIS — N529 Male erectile dysfunction, unspecified: Secondary | ICD-10-CM

## 2021-05-13 MED ORDER — DIAZEPAM 5 MG PO TABS: 5.0000 mg | ORAL_TABLET | Freq: Once | ORAL | 0 refills | Status: DC | PRN

## 2021-05-13 NOTE — Patient Instructions (Addendum)

## 2021-05-13 NOTE — Progress Notes (Signed)
05/13/21 12:41 PM   Jenny Reichmann Shon Millet February 03, 1956 [id number]  CC: Elevated PSA, low testosterone, erectile dysfunction  HPI: I saw Mr. Adam Coffey today for the above issues.  He is a 66 year old male who recently retired from [employer] as, and was found to have an elevated PSA of 8.2 in February 2022.  Testosterone was also low at 222 at that time, and PCP recommended urology referral but patient deferred.  They did not provide testosterone replacement with his elevated PSA and recommended urology follow-up.  Repeat PSA this year continued to rise at 10.5, and he was amenable to urology consult.  He reports some mild to moderate ED that is only partially responsive to Cialis.  He only uses the Cialis about 25% the time and is not convinced it makes a significant improvement.  He is sexually active around 1 time per week.  He reports symptoms of fatigue, tiredness, and ED from his low testosterone.  No prior prostate biopsy, denies family history of prostate cancer.   PMH: Past Medical History:  Diagnosis Date   GERD (gastroesophageal reflux disease)    Hyperlipidemia    Hypertension     Surgical History: Past Surgical History:  Procedure Laterality Date   NO PAST SURGERIES      Family History: Family History  Problem Relation Age of Onset   Heart disease Father    Liver cancer Father     Social History:  reports that he has never smoked. He has never used smokeless tobacco. He reports that he does not drink alcohol and does not use drugs.  Physical Exam: BP (!) 150/84 (BP Location: Left Arm, Patient Position: Sitting, Cuff Size: Normal)    Pulse 80    Ht 5\' 10"  (1.778 m)    Wt 235 lb (106.6 kg)    BMI 33.72 kg/m    Constitutional:  Alert and oriented, No acute distress. Cardiovascular: No clubbing, cyanosis, or edema. Respiratory: Normal respiratory effort, no increased work of breathing. GI: Abdomen is soft, nontender, nondistended, no abdominal masses DRE:  Deferred until time of biopsy  Laboratory Data: Reviewed, see HPI  Assessment & Plan:   66 year old male with low testosterone of 222, as well as elevated PSA x2 of 8.2 and 10.5.  We discussed the controversies in relationship between testosterone and prostate cancer at length, and the importance of prostate biopsy, especially prior to prescribing any type of testosterone replacement.  We also discussed alternatives for the ED including alternative PDE 5 inhibitors or penile injections.  If prostate biopsy is negative, could consider testosterone replacement which may improve the ED.  We reviewed the implications of an elevated PSA and the uncertainty surrounding it. In general, a man's PSA increases with age and is produced by both normal and cancerous prostate tissue. The differential diagnosis for elevated PSA includes BPH, prostate cancer, infection, recent intercourse/ejaculation, recent urethroscopic manipulation (foley placement/cystoscopy) or trauma, and prostatitis.   Management of an elevated PSA can include observation or prostate biopsy and we discussed this in detail. Our goal is to detect clinically significant prostate cancers, and manage with either active surveillance, surgery, or radiation for localized disease. Risks of prostate biopsy include bleeding, infection (including life threatening sepsis), pain, and lower urinary symptoms. Hematuria, hematospermia, and blood in the stool are all common after biopsy and can persist up to 4 weeks.   -Follow-up for prostate biopsy-> consider testosterone replacement if biopsy negative -Will need repeat morning testosterone   Nickolas Madrid, MD 05/13/2021  [facility] [address] [place], [place] [address] [phone]

## 2021-05-28 ENCOUNTER — Other Ambulatory Visit: Payer: Self-pay

## 2021-05-28 ENCOUNTER — Encounter: Payer: Self-pay | Admitting: Urology

## 2021-05-28 ENCOUNTER — Ambulatory Visit (INDEPENDENT_AMBULATORY_CARE_PROVIDER_SITE_OTHER): Payer: Medicare Other | Admitting: Urology

## 2021-05-28 VITALS — BP 134/80 | HR 66 | Ht 70.0 in | Wt 235.0 lb

## 2021-05-28 DIAGNOSIS — R972 Elevated prostate specific antigen [PSA]: Secondary | ICD-10-CM | POA: Diagnosis not present

## 2021-05-28 MED ORDER — GENTAMICIN SULFATE 40 MG/ML IJ SOLN
80.0000 mg | Freq: Once | INTRAMUSCULAR | Status: AC
  Administered 2021-05-28: 80 mg via INTRAMUSCULAR

## 2021-05-28 MED ORDER — LEVOFLOXACIN 500 MG PO TABS
500.0000 mg | ORAL_TABLET | Freq: Once | ORAL | Status: AC
  Administered 2021-05-28: 500 mg via ORAL

## 2021-05-28 NOTE — Patient Instructions (Signed)

## 2021-05-28 NOTE — Progress Notes (Signed)
° °  05/28/21  Indication: Elevated PSA, 10.5  Prostate Biopsy Procedure   Informed consent was obtained, and we discussed the risks of bleeding and infection/sepsis. A time out was performed to ensure correct patient identity.  Pre-Procedure: - Last PSA Level: 10.5 - Gentamicin and levaquin given for antibiotic prophylaxis - Transrectal Ultrasound performed revealing a 208 gm prostate, PSA density 0.05 - No significant hypoechoic or median lobe noted  Procedure: - Prostate block performed using 10 cc 1% lidocaine and biopsies taken from sextant areas, a total of 12 under ultrasound guidance.  Post-Procedure: - Patient tolerated the procedure well - He was counseled to seek immediate medical attention if experiences significant bleeding, fevers, or severe pain - Return in one week to discuss biopsy results  Assessment/ Plan: Will follow up in 1-2 weeks to discuss pathology Consider testosterone replacement follow-up  Legrand Rams, MD 05/28/2021

## 2021-05-30 LAB — SURGICAL PATHOLOGY

## 2021-06-05 ENCOUNTER — Other Ambulatory Visit: Payer: Self-pay

## 2021-06-05 ENCOUNTER — Encounter: Payer: Self-pay | Admitting: Urology

## 2021-06-05 ENCOUNTER — Ambulatory Visit (INDEPENDENT_AMBULATORY_CARE_PROVIDER_SITE_OTHER): Payer: Medicare Other | Admitting: Urology

## 2021-06-05 VITALS — BP 145/91 | HR 67 | Ht 70.0 in | Wt 230.0 lb

## 2021-06-05 DIAGNOSIS — R972 Elevated prostate specific antigen [PSA]: Secondary | ICD-10-CM | POA: Diagnosis not present

## 2021-06-05 DIAGNOSIS — E291 Testicular hypofunction: Secondary | ICD-10-CM

## 2021-06-05 NOTE — Patient Instructions (Signed)
Hypogonadism, Male °Male hypogonadism is a condition of having a level of testosterone that is lower than normal. Testosterone is a chemical, or hormone, that is made mainly in the testicles. °In boys, testosterone is responsible for the development of male characteristics during puberty. These include: °Making the penis bigger. °Growing and building the muscles. °Growing facial hair. °Deepening the voice. °In adult men, testosterone is responsible for maintaining: °An interest in sex and the ability to have sex. °Muscle mass. °Sperm production. °Red blood cell production. °Bone strength. °Testosterone also gives men energy and a sense of well-being. °Testosterone normally decreases as men age and the testicles make less testosterone. Testosterone levels can vary from man to man. Not all men will have signs and symptoms of low testosterone. Weight, alcohol use, medicines, and certain medical conditions can affect a man's testosterone level. °What are the causes? °This condition is caused by: °A natural decrease in testosterone that occurs as a man grows older. This is the main cause of this condition. °Use of medicines, such as antidepressants, steroids, and opioids. °Diseases and conditions that affect the testicles or the making of testosterone. These include: °Injury or damage to the testicles from trauma, cancer, cancer treatment, or infection. °Diabetes. °Sleep apnea. °Genetic conditions that men are born with. °Disease of the pituitary gland. This gland is in the brain. It produces hormones. °Obesity. °Metabolic syndrome. This is a group of diseases that affect blood pressure, blood sugar, cholesterol, and belly fat. °HIV or AIDS. °Alcohol abuse. °Kidney failure. °Other long-term or chronic diseases. °What are the signs or symptoms? °Common symptoms of this condition include: °Loss of interest in sex (low sex drive). °Inability to have or maintain an erection (erectile dysfunction). °Feeling tired  (fatigue). °Mood changes, like irritability or depression. °Loss of muscle and body hair. °Infertility. °Large breasts. °Weight gain (obesity). °How is this diagnosed? °Your health care provider can diagnose hypogonadism based on: °Your signs and symptoms. °A physical exam to check your testosterone levels. This includes blood tests. Testosterone levels can change throughout the day. Levels are highest in the morning. You may need to have repeat blood tests before getting a diagnosis of hypogonadism. °Depending on your medical history and test results, your health care provider may also do other tests to find the cause of low testosterone. °How is this treated? °This condition is treated with testosterone replacement therapy. Testosterone can be given by: °Injection or through pellets inserted under the skin. °Gels or patches placed on the skin or in the mouth. °Testosterone therapy is not for everyone. It has risks and side effects. Your health care provider will consider your medical history, your risk for prostate cancer, your age, and your symptoms before putting you on testosterone replacement therapy. °Follow these instructions at home: °Take over-the-counter and prescription medicines only as told by your health care provider. °Eat foods that are high in fiber, such as beans, whole grains, and fresh fruits and vegetables. Limit foods that are high in fat and processed sugars, such as fried or sweet foods. °If you drink alcohol: °Limit how much you have to 0-2 drinks a day. °Know how much alcohol is in your drink. In the [place] one drink equals one 12 oz bottle of beer (355 mL), one 5 oz glass of wine (148 mL), or one 1½ oz glass of hard liquor (44 mL). °Return to your normal activities as told by your health care provider. Ask your health care provider what activities are safe for you. °Keep all follow-up   visits. This is important. °Contact a health care provider if: °You have any of the signs or symptoms of  low testosterone. °You have any side effects from testosterone therapy. °Summary °Male hypogonadism is a condition of having a level of testosterone that is lower than normal. °The natural drop in testosterone production that occurs with age is the most common cause of this condition. °Low testosterone can also be caused by many diseases and conditions that affect the testicles and the making of testosterone. °This condition is treated with testosterone replacement therapy. °There are risks and side effects of testosterone therapy. Your health care provider will consider your age, medical history, symptoms, and risks for prostate cancer before putting you on testosterone therapy. °This information is not intended to replace advice given to you by your health care provider. Make sure you discuss any questions you have with your health care provider. °Document Revised: 12/07/2019 Document Reviewed: 12/07/2019 °Elsevier Patient Education © 2022 Elsevier Inc. ° ° °Testosterone Replacement Therapy °Testosterone replacement therapy (TRT) treats men who have a low testosterone level. Testosterone is a male hormone that is produced in the testicles. It is responsible for typical male characteristics and for maintaining a man's sex drive and ability to get an erection. Testosterone also supports bone and muscle health. °Low testosterone may not need to be treated. Your health care provider may recommend TRT if you have symptoms such as a low sex drive or erection problems, weak muscles or bones, or low energy. °Types of TRT °You and your health care provider will decide which form is best for you. TRT is available in the following forms: °Topical gels, creams, lotions, or sprays. Do not let other people, especially women or children, come in contact with the skin where the testosterone was applied. °Nasal gels. °Patches. °Pills. °Injections into the muscle or under the skin. °Long-acting pellets inserted under the skin. °The  amount of TRT you take and how long you take it is based on your condition. It is important to: °Begin TRT with the lowest possible dosage. °Stop TRT if your health care provider tells you to stop. °Work with your health care provider so that you feel informed and comfortable with your decision. °Tell a health care provider about: °Any allergies you have. °Any personal or family history of blood clots, breast cancer, or prostate cancer. °If you have sleep apnea or have been told that you snore. °All medicines you are taking, including vitamins, herbs, eye drops, creams, and over-the-counter medicines. °Any surgeries you have had. °Any other medical conditions you have. °If you wish to have biological children someday. °What are the benefits? °Benefits of TRT vary but may include improved sexual function, muscle mass or strength, mood, or quality of life. °What are the risks? °Risks of TRT vary depending on your individual and medical history. Side effects can be related to the type of TRT you choose. If you choose products that are used on the skin, you may have skin irritation. If you get injections, you may have redness and swelling at the injection site. °Side effects that can happen with any form of TRT include: °Lower sperm count. °Acne. °Swelling of your legs or feet, or tenderness in the chest or breast area. °Sleep disturbances and mood swings. °Enlarged prostate. °Increase in your red blood count. °It is unclear if testosterone increases the risk of some serious conditions. Talk with your health care provider about your risk for these conditions, including: °Blood clots. °Heart disease, such as   stroke or heart attack. °Prostate cancer. °Risks of TRT may increase if you: °Have had or are at risk for prostate cancer or breast cancer. °Have had a previous stroke or heart attack. °Have a high number of red blood cells. °Have treated or untreated sleep apnea. °Have a large prostate. °The long-term safety of TRT  is not known. °Follow these instructions at home: °Take over-the-counter and prescription medicines only as told by your health care provider. °Lose weight if you are overweight. Ask your health care provider to help you start a healthy diet and exercise program to reach and maintain a healthy weight. °Work with your health care provider to manage other medical conditions that may lower your testosterone. These include obesity, high blood pressure, high cholesterol, diabetes, liver disease, kidney disease, and sleep apnea. °Do not use any testosterone replacement therapies that are not prescribed by your health care provider. °Keep all follow-up visits. This is important. °Where to find more information °American U[employer] Foundation: www.urologyhealth.org °Endocrine Society: www.hormone.org °Contact a health care provider if: °You have side effects from your TRT. °You have pain or swelling in your legs. °You have problems urinating. °You have lumps or changes in your breasts or armpits. °Get help right away if: °You have shortness of breath. °You have chest pain. °You have slurred speech. °You have weakness or numbness in any part of your arms or legs. °These symptoms may represent a serious problem that is an emergency. Do not wait to see if the symptoms will go away. Get medical help right away. Call your local emergency services (911 in the [place] Do not drive yourself to the hospital. °Summary °Testosterone replacement therapy (TRT) is used to treat men who have a low testosterone level. °TRT should only be prescribed by and under the supervision of a health care provider. °Tell a health care provider about any medical conditions you have. °TRT may have side effects. °Talk with your health care provider about all of the risks and benefits before you start therapy. °This information is not intended to replace advice given to you by your health care provider. Make sure you discuss any questions you have with  your health care provider. °Document Revised: 01/30/2020 Document Reviewed: 01/30/2020 °Elsevier Patient Education © 2022 Elsevier Inc. ° °

## 2021-06-05 NOTE — Progress Notes (Signed)
° °  06/05/2021 10:30 AM   [place] 1955-05-11 [id number]  Reason for visit: Follow up prostate biopsy results, low testosterone  HPI: 66 year old male with low testosterone of 222 with symptoms of decreased energy, fatigue, tiredness, and ED.  He takes Cialis 25% the time but is not convinced that this makes a significant improvement in his ED.  He also was found to have an elevated PSA of 8.2 and 10.5, and underwent a prostate biopsy on 05/28/2021 prior to considering any testosterone replacement.  Prostate biopsy showed a 208 g prostate with a PSA density of 0.05, and all cores were benign.  Reassurance was provided today regarding BPH is the most likely etiology of his elevated PSA.  He denies any urinary symptoms of weak stream or trouble urinating.  We discussed options for testosterone replacement ranging from gels, injections, Testopel, nasal spray, and pills.  We discussed possible impact on his BPH and this could exacerbate symptoms, and we briefly discussed medication options or even HOLEP in the future if he had worsening urinary symptoms on testosterone replacement.  We also reviewed that this could cause his PSA to rise, and I think monitoring is sufficient with his very large prostate and negative biopsy.  He is unsure which type of testosterone replacement he would like to pursue, and will repeat testosterone, LH today, recent hematocrit normal at 41.4.  RTC with Larene Beach to review repeat testosterone/LH lab, and discuss testosterone replacement therapy  I spent 30 total minutes on the day of the encounter including pre-visit review of the medical record, face-to-face time with the patient, and post visit ordering of labs/imaging/tests.   [place], [facility] [address] [place], [place] [address] [phone]

## 2021-06-06 LAB — TESTOSTERONE: Testosterone: 194 ng/dL — ABNORMAL LOW (ref 264–916)

## 2021-06-06 LAB — LUTEINIZING HORMONE: LH: 2.4 m[IU]/mL (ref 1.7–8.6)

## 2021-06-22 NOTE — Progress Notes (Unsigned)
06/23/21 4:01 PM   Jonny Ruiz Everlene Balls 12-Jan-1956 [id number]  Referring provider:  Reubin Milan, MD [address] [place],  [place] [address]  Chief Complaint  Patient presents with   Follow-up    Low testosterone    Urological history Testosterone deficiency  - Testosterone on 06/05/2021 was 194 - LH normal   2. Elevated PSA  - Prostate biopsy with Dr. Richardo Hanks showed a 208 g prostate with a PSA density of 0.05, and all cores were benign.  3. BPH  - Not managed on any medication   4. ED  -  He takes Cialis 25% the time but is not convinced that this makes a significant improvement in his ED.   HPI: Adam Coffey is a 66 y.o.male who presents today for discussion of treatment for testosterone deficiency with his wife, Adam Coffey.  Component     Latest Ref Rng & Units 05/02/2020 06/05/2021  Testosterone     264 - 916 ng/dL 502 (L) 774 (L)   Component     Latest Ref Rng & Units 06/05/2021  LH     1.7 - 8.6 mIU/mL 2.4   Prolactin level is drawn today.    He reports symptoms of fatigue, difficulty achieving orgasm, losing muscle mass and weight gain for the last 5 years.    PMH: Past Medical History:  Diagnosis Date   GERD (gastroesophageal reflux disease)    Hyperlipidemia    Hypertension     Surgical History: Past Surgical History:  Procedure Laterality Date   NO PAST SURGERIES      Home Medications:  Allergies as of 06/23/2021   No Known Allergies      Medication List        Accurate as of June 23, 2021  4:01 PM. If you have any questions, ask your nurse or doctor.          amLODipine 10 MG tablet Commonly known as: NORVASC Take 1 tablet (10 mg total) by mouth daily.   atorvastatin 10 MG tablet Commonly known as: LIPITOR Take 1 tablet (10 mg total) by mouth daily.   esomeprazole 20 MG capsule Commonly known as: NEXIUM Take 1 tablet by mouth daily. OTC   temazepam 15 MG capsule Commonly known as: RESTORIL Take 1  capsule (15 mg total) by mouth at bedtime as needed for sleep.        Allergies:  No Known Allergies  Family History: Family History  Problem Relation Age of Onset   Heart disease Father    Liver cancer Father     Social History:  reports that he has never smoked. He has never been exposed to tobacco smoke. He has never used smokeless tobacco. He reports that he does not drink alcohol and does not use drugs.   Physical Exam: BP (!) 155/83    Pulse 76    Ht 5\' 10"  (1.778 m)    Wt 240 lb (108.9 kg)    BMI 34.44 kg/m   Constitutional:  Well nourished. Alert and oriented, No acute distress. HEENT: [place] AT, mask in place.  Trachea midline Cardiovascular: No clubbing, cyanosis, or edema. Respiratory: Normal respiratory effort, no increased work of breathing. Neurologic: Grossly intact, no focal deficits, moving all 4 extremities. Psychiatric: Normal mood and affect.   Laboratory Data:  Lab Results  Component Value Date   CREATININE 1.23 04/29/2021   Component     Latest Ref Rng & Units 05/02/2020 06/05/2021  Testosterone  264 - 916 ng/dL 785 (L) 885 (L)   Component     Latest Ref Rng & Units 06/05/2021  LH     1.7 - 8.6 mIU/mL 2.4  I have reviewed the labs.    Pertinent Imaging: N/A  Assessment & Plan:    1. Testosterone deficiency -Significant symptoms -Recommend starting TRT -Potential side effects of testosterone replacement were discussed including stimulation of benign prostatic growth with lower urinary tract symptoms; erythrocytosis; edema; gynecomastia; worsening sleep apnea; venous thromboembolism; testicular atrophy and infertility. Recent studies suggesting an increased incidence of heart attack and stroke in patients taking testosterone was discussed. He was informed there is conflicting evidence regarding the impact of testosterone therapy on cardiovascular risk. The theoretical risk of growth stimulation of an undetected prostate cancer was also discussed.   He was informed that current evidence does not provide any definitive answers regarding the risks of testosterone therapy on prostate cancer and cardiovascular disease. The need for periodic monitoring of his testosterone level, PSA, hematocrit and DRE was discussed. -he is mostly interested in the pellets or the gels -will check on insurance coverage for the pellets as this is his preferred method  2. Anxiety with flying  -patient would like a refill on his Restoril for an upcoming trips -refill for Restoril 15 mg, to take on long flights # 20 tablets   ***    No follow-ups on file.  [facility] [address] [place], [place] [address] [phone]

## 2021-06-23 ENCOUNTER — Encounter: Payer: Self-pay | Admitting: Urology

## 2021-06-23 ENCOUNTER — Other Ambulatory Visit
Admission: RE | Admit: 2021-06-23 | Discharge: 2021-06-23 | Disposition: A | Discharge status: Home / Self Care | Payer: Self-pay | Attending: Urology | Admitting: Urology

## 2021-06-23 ENCOUNTER — Other Ambulatory Visit: Payer: Self-pay | Admitting: *Deleted

## 2021-06-23 ENCOUNTER — Ambulatory Visit (INDEPENDENT_AMBULATORY_CARE_PROVIDER_SITE_OTHER): Payer: Medicare Other | Admitting: Urology

## 2021-06-23 ENCOUNTER — Other Ambulatory Visit: Payer: Self-pay

## 2021-06-23 DIAGNOSIS — E291 Testicular hypofunction: Secondary | ICD-10-CM | POA: Insufficient documentation

## 2021-06-23 DIAGNOSIS — F40243 Fear of flying: Secondary | ICD-10-CM

## 2021-06-23 MED ORDER — TADALAFIL 20 MG PO TABS: 20.0000 mg | ORAL_TABLET | Freq: Every day | ORAL | 3 refills | Status: AC | PRN

## 2021-06-23 MED ORDER — TEMAZEPAM 15 MG PO CAPS: 15.0000 mg | ORAL_CAPSULE | Freq: Every evening | ORAL | 0 refills | Status: AC | PRN

## 2021-06-23 NOTE — Addendum Note (Signed)
Addended by: Despina Hidden on: 06/23/2021 03:01 PM ? ? Modules accepted: Orders ? ?

## 2021-06-24 LAB — PROLACTIN: Prolactin: 13 ng/mL (ref 4.0–15.2)

## 2021-06-25 ENCOUNTER — Telehealth: Payer: Self-pay | Admitting: Family Medicine

## 2021-06-25 ENCOUNTER — Telehealth: Payer: Self-pay | Admitting: *Deleted

## 2021-06-25 NOTE — Telephone Encounter (Signed)
-----   Message from Harle Battiest, PA-C sent at 06/23/2021  4:34 PM EST ----- ?Regarding: Testopel Inquiry ?Would you check with his insurance to see if it will cover Testopel? ? ?

## 2021-06-25 NOTE — Telephone Encounter (Signed)
LMOM informed patient that testopel does not need authorization for Medicare. He is to call back to schedule Testopel if he is wants to do it.  ?

## 2021-06-25 NOTE — Telephone Encounter (Signed)
-----   Message from Harle Battiest, PA-C sent at 06/24/2021 11:53 AM EST ----- ?Please let Adam Coffey know that his prolactin level was normal, so he does not have a pituitary issue or tumor.  In regards to the Testopel pellets, we do not need prior authorization with Medicare.  I would recommend for him to call his Medicare supplier and ask them what his out-of-pocket cost would be.  He would need to give him the CPT code 73220 (insertion charge) and the J code (medication charge) J3490 x 6 (for the 6 pellets) and they can give him the price for each and what he would be responsible for.  The cash price without insurance would be $1645.   ?

## 2021-06-30 ENCOUNTER — Other Ambulatory Visit: Payer: Self-pay | Admitting: Urology

## 2021-06-30 ENCOUNTER — Telehealth: Payer: Self-pay | Admitting: *Deleted

## 2021-06-30 DIAGNOSIS — E291 Testicular hypofunction: Secondary | ICD-10-CM

## 2021-06-30 MED ORDER — TESTOSTERONE 20.25 MG/1.25GM (1.62%) TD GEL: 2.0000 | Freq: Every day | TRANSDERMAL | 3 refills | Status: AC

## 2021-06-30 NOTE — Telephone Encounter (Signed)
Spoke with patient and he will come to [place] to have labs done, orders in, pt aware. ?

## 2021-06-30 NOTE — Telephone Encounter (Signed)
Patient called in today and states he wants to do the gel not the testopel ?

## 2021-06-30 NOTE — Addendum Note (Signed)
Addended by: Despina Hidden on: 06/30/2021 03:57 PM ? ? Modules accepted: Orders ? ?

## 2021-07-31 ENCOUNTER — Other Ambulatory Visit
Admission: RE | Admit: 2021-07-31 | Discharge: 2021-07-31 | Disposition: A | Discharge status: Home / Self Care | Payer: Medicare Other | Attending: Urology | Admitting: Urology

## 2021-07-31 ENCOUNTER — Other Ambulatory Visit: Payer: Self-pay

## 2021-07-31 DIAGNOSIS — E291 Testicular hypofunction: Secondary | ICD-10-CM

## 2021-08-01 LAB — TESTOSTERONE: Testosterone: 916 ng/dL (ref 264–916)

## 2021-08-04 ENCOUNTER — Emergency Department: Payer: Medicare Other

## 2021-08-04 ENCOUNTER — Other Ambulatory Visit: Payer: Self-pay

## 2021-08-04 ENCOUNTER — Inpatient Hospital Stay: Admit: 2021-08-04 | Payer: Medicare Other

## 2021-08-04 DIAGNOSIS — Z8249 Family history of ischemic heart disease and other diseases of the circulatory system: Secondary | ICD-10-CM

## 2021-08-04 DIAGNOSIS — J939 Pneumothorax, unspecified: Secondary | ICD-10-CM

## 2021-08-04 DIAGNOSIS — E785 Hyperlipidemia, unspecified: Secondary | ICD-10-CM | POA: Diagnosis present

## 2021-08-04 DIAGNOSIS — M96A3 Multiple fractures of ribs associated with chest compression and cardiopulmonary resuscitation: Secondary | ICD-10-CM | POA: Diagnosis present

## 2021-08-04 DIAGNOSIS — J9602 Acute respiratory failure with hypercapnia: Secondary | ICD-10-CM | POA: Diagnosis present

## 2021-08-04 DIAGNOSIS — G931 Anoxic brain damage, not elsewhere classified: Secondary | ICD-10-CM | POA: Diagnosis present

## 2021-08-04 DIAGNOSIS — S270XXA Traumatic pneumothorax, initial encounter: Secondary | ICD-10-CM | POA: Diagnosis present

## 2021-08-04 DIAGNOSIS — I214 Non-ST elevation (NSTEMI) myocardial infarction: Secondary | ICD-10-CM | POA: Diagnosis present

## 2021-08-04 DIAGNOSIS — Z20822 Contact with and (suspected) exposure to covid-19: Secondary | ICD-10-CM | POA: Diagnosis present

## 2021-08-04 DIAGNOSIS — Z79899 Other long term (current) drug therapy: Secondary | ICD-10-CM

## 2021-08-04 DIAGNOSIS — K219 Gastro-esophageal reflux disease without esophagitis: Secondary | ICD-10-CM | POA: Diagnosis present

## 2021-08-04 DIAGNOSIS — Z66 Do not resuscitate: Secondary | ICD-10-CM | POA: Diagnosis not present

## 2021-08-04 DIAGNOSIS — Z515 Encounter for palliative care: Secondary | ICD-10-CM | POA: Diagnosis not present

## 2021-08-04 DIAGNOSIS — I462 Cardiac arrest due to underlying cardiac condition: Secondary | ICD-10-CM | POA: Diagnosis present

## 2021-08-04 DIAGNOSIS — J9600 Acute respiratory failure, unspecified whether with hypoxia or hypercapnia: Secondary | ICD-10-CM

## 2021-08-04 DIAGNOSIS — G928 Other toxic encephalopathy: Secondary | ICD-10-CM | POA: Diagnosis present

## 2021-08-04 DIAGNOSIS — I255 Ischemic cardiomyopathy: Secondary | ICD-10-CM

## 2021-08-04 DIAGNOSIS — J9601 Acute respiratory failure with hypoxia: Secondary | ICD-10-CM | POA: Diagnosis present

## 2021-08-04 DIAGNOSIS — I469 Cardiac arrest, cause unspecified: Secondary | ICD-10-CM

## 2021-08-04 DIAGNOSIS — I10 Essential (primary) hypertension: Secondary | ICD-10-CM | POA: Diagnosis present

## 2021-08-04 DIAGNOSIS — R570 Cardiogenic shock: Secondary | ICD-10-CM | POA: Diagnosis present

## 2021-08-04 DIAGNOSIS — S2249XA Multiple fractures of ribs, unspecified side, initial encounter for closed fracture: Secondary | ICD-10-CM

## 2021-08-04 DIAGNOSIS — N179 Acute kidney failure, unspecified: Secondary | ICD-10-CM | POA: Diagnosis present

## 2021-08-04 LAB — MAGNESIUM: Magnesium: 2.7 mg/dL — ABNORMAL HIGH (ref 1.7–2.4)

## 2021-08-04 LAB — URINE DRUG SCREEN, QUALITATIVE (ARMC ONLY)
Amphetamines, Ur Screen: NOT DETECTED
Barbiturates, Ur Screen: NOT DETECTED
Benzodiazepine, Ur Scrn: POSITIVE — AB
Cannabinoid 50 Ng, Ur ~~LOC~~: NOT DETECTED
Cocaine Metabolite,Ur ~~LOC~~: NOT DETECTED
MDMA (Ecstasy)Ur Screen: NOT DETECTED
Methadone Scn, Ur: NOT DETECTED
Opiate, Ur Screen: NOT DETECTED
Phencyclidine (PCP) Ur S: NOT DETECTED
Tricyclic, Ur Screen: NOT DETECTED

## 2021-08-04 LAB — URINALYSIS, ROUTINE W REFLEX MICROSCOPIC
Bilirubin Urine: NEGATIVE
Glucose, UA: NEGATIVE mg/dL
Ketones, ur: NEGATIVE mg/dL
Nitrite: NEGATIVE
Protein, ur: 30 mg/dL — AB
Specific Gravity, Urine: 1.018 (ref 1.005–1.030)
Squamous Epithelial / HPF: NONE SEEN (ref 0–5)
pH: 6 (ref 5.0–8.0)

## 2021-08-04 LAB — RESP PANEL BY RT-PCR (FLU A&B, COVID) ARPGX2
Influenza A by PCR: NEGATIVE
Influenza B by PCR: NEGATIVE
SARS Coronavirus 2 by RT PCR: NEGATIVE

## 2021-08-04 LAB — CBC
HCT: 43.3 % (ref 39.0–52.0)
Hemoglobin: 13.6 g/dL (ref 13.0–17.0)
MCH: 28.8 pg (ref 26.0–34.0)
MCHC: 31.4 g/dL (ref 30.0–36.0)
MCV: 91.7 fL (ref 80.0–100.0)
Platelets: 219 10*3/uL (ref 150–400)
RBC: 4.72 MIL/uL (ref 4.22–5.81)
RDW: 13.2 % (ref 11.5–15.5)
WBC: 10.8 10*3/uL — ABNORMAL HIGH (ref 4.0–10.5)
nRBC: 0.2 % (ref 0.0–0.2)

## 2021-08-04 LAB — CBG MONITORING, ED: Glucose-Capillary: 198 mg/dL — ABNORMAL HIGH (ref 70–99)

## 2021-08-04 LAB — BASIC METABOLIC PANEL
Anion gap: 13 (ref 5–15)
BUN: 28 mg/dL — ABNORMAL HIGH (ref 8–23)
CO2: 22 mmol/L (ref 22–32)
Calcium: 8.5 mg/dL — ABNORMAL LOW (ref 8.9–10.3)
Chloride: 102 mmol/L (ref 98–111)
Creatinine, Ser: 1.76 mg/dL — ABNORMAL HIGH (ref 0.61–1.24)
GFR, Estimated: 42 mL/min — ABNORMAL LOW (ref >60)
Glucose, Bld: 330 mg/dL — ABNORMAL HIGH (ref 70–99)
Potassium: 4.2 mmol/L (ref 3.5–5.1)
Sodium: 137 mmol/L (ref 135–145)

## 2021-08-04 LAB — TROPONIN I (HIGH SENSITIVITY): Troponin I (High Sensitivity): 50 ng/L — ABNORMAL HIGH (ref <18)

## 2021-08-04 LAB — MRSA NEXT GEN BY PCR, NASAL: MRSA by PCR Next Gen: NOT DETECTED

## 2021-08-04 LAB — SAMPLE TO BLOOD BANK

## 2021-08-04 LAB — PROCALCITONIN: Procalcitonin: <0.1 ng/mL

## 2021-08-04 LAB — BRAIN NATRIURETIC PEPTIDE: B Natriuretic Peptide: 79 pg/mL (ref 0.0–100.0)

## 2021-08-04 MED ORDER — MIDAZOLAM HCL 2 MG/2ML IJ SOLN
2.0000 mg | Freq: Once | INTRAMUSCULAR | Status: AC
  Administered 2021-08-04: 2 mg via INTRAVENOUS

## 2021-08-04 MED ORDER — GLYCOPYRROLATE 0.2 MG/ML IJ SOLN: 0.2000 mg | INTRAMUSCULAR | Status: DC | PRN

## 2021-08-04 MED ORDER — EPINEPHRINE HCL 5 MG/250ML IV SOLN IN NS
0.5000 ug/min | INTRAVENOUS | Status: DC
  Administered 2021-08-04: 30 ug/min via INTRAVENOUS
  Administered 2021-08-04: 20 ug/min via INTRAVENOUS
  Administered 2021-08-04 (×2): 30 ug/min via INTRAVENOUS
  Filled 2021-08-04 (×5): qty 250

## 2021-08-04 MED ORDER — FAMOTIDINE IN NACL 20-0.9 MG/50ML-% IV SOLN: 20.0000 mg | Freq: Two times a day (BID) | INTRAVENOUS | Status: DC

## 2021-08-04 MED ORDER — ACETAMINOPHEN 325 MG PO TABS: 650.0000 mg | ORAL_TABLET | ORAL | Status: DC | PRN

## 2021-08-04 MED ORDER — DEXTROSE 5 % IV SOLN: INTRAVENOUS | Status: DC

## 2021-08-04 MED ORDER — IOHEXOL 350 MG/ML SOLN
50.0000 mL | Freq: Once | INTRAVENOUS | Status: AC | PRN
Start: 2021-08-04 — End: 2021-08-04
  Administered 2021-08-04: 50 mL via INTRAVENOUS

## 2021-08-04 MED ORDER — SODIUM BICARBONATE 8.4 % IV SOLN
INTRAVENOUS | Status: AC | PRN
  Administered 2021-08-04 (×2): 50 meq via INTRAVENOUS

## 2021-08-04 MED ORDER — POLYETHYLENE GLYCOL 3350 17 G PO PACK: 17.0000 g | PACK | Freq: Every day | ORAL | Status: DC | PRN

## 2021-08-04 MED ORDER — SODIUM CHLORIDE 0.9 % IV SOLN: 250.0000 mL | INTRAVENOUS | Status: DC | PRN

## 2021-08-04 MED ORDER — SODIUM CHLORIDE 0.9 % IV BOLUS
1000.0000 mL | Freq: Once | INTRAVENOUS | Status: AC
  Administered 2021-08-04: 1000 mL via INTRAVENOUS

## 2021-08-04 MED ORDER — DIPHENHYDRAMINE HCL 50 MG/ML IJ SOLN: 25.0000 mg | INTRAMUSCULAR | Status: DC | PRN

## 2021-08-04 MED ORDER — MIDAZOLAM HCL 2 MG/2ML IJ SOLN
2.0000 mg | Freq: Once | INTRAMUSCULAR | Status: AC
  Administered 2021-08-04: 2 mg via INTRAVENOUS
  Filled 2021-08-04: qty 2

## 2021-08-04 MED ORDER — LEVETIRACETAM IN NACL 500 MG/100ML IV SOLN
500.0000 mg | Freq: Two times a day (BID) | INTRAVENOUS | Status: DC
  Filled 2021-08-04 (×2): qty 100

## 2021-08-04 MED ORDER — EPINEPHRINE 1 MG/10ML IJ SOSY
PREFILLED_SYRINGE | INTRAMUSCULAR | Status: AC | PRN
  Administered 2021-08-04 (×4): 1 mg via INTRAVENOUS

## 2021-08-04 MED ORDER — SODIUM CHLORIDE 0.9 % IV SOLN
INTRAVENOUS | Status: AC | PRN
  Administered 2021-08-04: 999 mL/h via INTRAVENOUS

## 2021-08-04 MED ORDER — TETANUS-DIPHTH-ACELL PERTUSSIS 5-2.5-18.5 LF-MCG/0.5 IM SUSY: 0.5000 mL | PREFILLED_SYRINGE | Freq: Once | INTRAMUSCULAR | Status: DC

## 2021-08-04 MED ORDER — HEPARIN SODIUM (PORCINE) 5000 UNIT/ML IJ SOLN: 5000.0000 [IU] | Freq: Three times a day (TID) | INTRAMUSCULAR | Status: DC

## 2021-08-04 MED ORDER — GLYCOPYRROLATE 1 MG PO TABS
1.0000 mg | ORAL_TABLET | ORAL | Status: DC | PRN
  Filled 2021-08-04: qty 1

## 2021-08-04 MED ORDER — SODIUM CHLORIDE 0.9% FLUSH
3.0000 mL | Freq: Two times a day (BID) | INTRAVENOUS | Status: DC
  Administered 2021-08-04: 3 mL via INTRAVENOUS

## 2021-08-04 MED ORDER — ONDANSETRON HCL 4 MG/2ML IJ SOLN: 4.0000 mg | Freq: Four times a day (QID) | INTRAMUSCULAR | Status: DC | PRN

## 2021-08-04 MED ORDER — MIDAZOLAM HCL 2 MG/2ML IJ SOLN
INTRAMUSCULAR | Status: AC
  Filled 2021-08-04: qty 2

## 2021-08-04 MED ORDER — POLYVINYL ALCOHOL 1.4 % OP SOLN
1.0000 [drp] | Freq: Four times a day (QID) | OPHTHALMIC | Status: DC | PRN
  Filled 2021-08-04: qty 15

## 2021-08-04 MED ORDER — AMIODARONE IV BOLUS ONLY 150 MG/100ML
300.0000 mg | Freq: Once | INTRAVENOUS | Status: AC
  Administered 2021-08-04: 300 mg via INTRAVENOUS

## 2021-08-04 MED ORDER — ACETAMINOPHEN 650 MG RE SUPP: 650.0000 mg | Freq: Four times a day (QID) | RECTAL | Status: DC | PRN

## 2021-08-04 MED ORDER — ATROPINE SULFATE 1 MG/ML IV SOLN
INTRAVENOUS | Status: AC | PRN
  Administered 2021-08-04: 1 mg via INTRAVENOUS

## 2021-08-04 MED ORDER — CALCIUM GLUCONATE-NACL 1-0.675 GM/50ML-% IV SOLN
1.0000 g | Freq: Once | INTRAVENOUS | Status: AC
  Administered 2021-08-04: 1000 mg via INTRAVENOUS

## 2021-08-04 MED ORDER — EPINEPHRINE HCL 5 MG/250ML IV SOLN IN NS: 0.5000 ug/min | INTRAVENOUS | Status: DC

## 2021-08-04 MED ORDER — NOREPINEPHRINE 4 MG/250ML-% IV SOLN
0.0000 ug/min | INTRAVENOUS | Status: DC
  Administered 2021-08-04: 5 ug/min via INTRAVENOUS

## 2021-08-04 MED ORDER — MORPHINE SULFATE (PF) 4 MG/ML IV SOLN
INTRAVENOUS | Status: AC
  Administered 2021-08-04: 4 mg
  Filled 2021-08-04: qty 1

## 2021-08-04 MED ORDER — CALCIUM CHLORIDE 10 % IV SOLN: 1.0000 g | Freq: Once | INTRAVENOUS | Status: DC

## 2021-08-04 MED ORDER — PANTOPRAZOLE SODIUM 40 MG IV SOLR: 40.0000 mg | Freq: Every day | INTRAVENOUS | Status: DC

## 2021-08-04 MED ORDER — MORPHINE SULFATE (PF) 2 MG/ML IV SOLN: 2.0000 mg | INTRAVENOUS | Status: DC | PRN

## 2021-08-04 MED ORDER — DOCUSATE SODIUM 100 MG PO CAPS: 100.0000 mg | ORAL_CAPSULE | Freq: Two times a day (BID) | ORAL | Status: DC | PRN

## 2021-08-04 MED ORDER — SODIUM CHLORIDE 0.9% FLUSH: 3.0000 mL | INTRAVENOUS | Status: DC | PRN

## 2021-08-04 MED ORDER — PIPERACILLIN-TAZOBACTAM 3.375 G IVPB
3.3750 g | Freq: Three times a day (TID) | INTRAVENOUS | Status: DC
Start: 2021-08-04 — End: 2021-08-04

## 2021-08-04 NOTE — Progress Notes (Signed)
Pt's family (wife, sons) at bedside have expressed their wishes that they would like to WITHDRAW CARE and transition pt to COMFORT MEASURES ONLY.  They are still unsure of time frame, as they are contemplating whether they want to wait for the pt's young granddaughter from [place] to arrive and visit prior to withdrawal.   ?Encouraged them there is no rush, and that when they as a family are ready, we can start the process at that time. ? ?Discussed what the withdrawal process entails, including extubation and stopping of vasopressors, along with PRN Morphine/ativan for air hunger/restlessness if needed to ensure pt is not suffering.   ? ? ?PLAN ?-DNR/DNI, Nurse may pronounce ?-No escalation of care beyond current measures (no central/arterial lines, chest tube, etc) ?-Transition to COMFORT MEASURES in the near future when family is ready ? ? ? ? ? ? ? ?Harlon Ditty, AGACNP-BC ?[facility] ?Prefer epic messenger for cross cover needs ?If after hours, please call E-link ? ?

## 2021-08-04 NOTE — Progress Notes (Signed)
Pt positive for good cuff leak per request by [employer] ?

## 2021-08-04 NOTE — Progress Notes (Signed)
Pt arrived to rm ICU 2, RT at bedside to secure ETT. Pt unresponsive, no reflexes, pupils 40mm sluggish/hippus, VSS, SR with frequent multifocal PVSc, See MAR for gtts. Laceration to pt forehead noted, per family pt fell prior to admit, MD aware. MD Kasa at bedside with family.  ?

## 2021-08-04 NOTE — H&P (Signed)
? ?NAME:  Adam Coffey, MRN:  [id number], DOB:  Nov 05, 1955, LOS: 0 ?ADMISSION DATE:  07/27/2021 ? ?CHIEF COMPLAINT:  Cardiac arrest ? ?BRIEF SYNOPSIS ? ?History of Present Illness:  ? ?66 y.o. male here with cardiac arrest.   ?Patient reportedly was in [facility], then felt dizzy  ?He reportedly began to feel nauseous, felt like he was going to vomit, then became unresponsive.   ? ?Bystander CPR was initiated and fire department was there fairly quickly.  ?Patient reportedly was in a shockable rhythm x2, then has since lost pulses and a PEA then asystole arrest in route.  PROLONGED CARDIAC ARREST of 45 MINS ? ?Patient reportedly has history of high blood pressure but otherwise no known significant history.  Is unsure whether he had chest pain.  CPR in progress on arrival. ?  ?Patient received multiple doses of epinephrine prior to arrival. ? ?ER course ?Intubated, on VENT ?Multiple vasopressors ?PCCM consulted ? ?Significant Hospital Events: ?Including procedures, antibiotic start and stop dates in addition to other pertinent events   ?4/17 admission for acute cardiac arrest with NSTEMI ? ? ? ? ?Interim History / Subjective:  ?Severe shock ?CT chest shows LEFT sided PTX ?CT head on  my  initial impression shows loss of gray and white matter will wait for fional read ?Severe hypoxia ? ? ?FiO2 (%):  [100 %] 100 % ? ? ? ? ?Objective   ?Blood pressure 126/73, pulse 85, resp. rate (!) 24, height $RemoveBe'5\' 10"'zyGuTkdYs$  (1.778 m), weight 111.1 kg, SpO2 (!) 50 %. ?   ?FiO2 (%):  [100 %] 100 %  ?No intake or output data in the 24 hours ending 08/10/2021 1329 ?Filed Weights  ? 07/23/2021 1155  ?Weight: 111.1 kg  ? ? ? ? ?REVIEW OF SYSTEMS ? ?PATIENT IS UNABLE TO PROVIDE COMPLETE REVIEW OF SYSTEMS DUE TO SEVERE CRITICAL ILLNESS AND TOXIC METABOLIC ENCEPHALOPATHY ? ? ?PHYSICAL EXAMINATION: ? ?GENERAL:critically ill appearing, +resp distress ?EYES: Pupils equal, round, reactive to light.  No scleral icterus.  ?MOUTH: Moist  mucosal membrane. INTUBATED ?NECK: Supple.  ?PULMONARY: +rhonchi, +wheezing ?CARDIOVASCULAR: S1 and S2.  No murmurs  ?GASTROINTESTINAL: Soft, nontender, -distended. Positive bowel sounds.  ?MUSCULOSKELETAL: No swelling, clubbing, or edema.  ?NEUROLOGIC: obtunded ?SKIN:intact,warm,dry ? ? ? ? ?Labs/imaging that I havepersonally reviewed  ?(right click and "Reselect all SmartList Selections" daily)  ? ? ? ? ?ASSESSMENT AND PLAN ?SYNOPSIS ? ?66 yo morbidly obese white male with acute sudden cardiac arrest due to cardiac ischemia with acute resp failure on vent with signs of brain damage with LT PTX likely from fall/CPR ? ?Severe ACUTE Hypoxic and Hypercapnic Respiratory Failure ?-continue Mechanical Ventilator support ?-continue Bronchodilator Therapy ?-Wean Fio2 and PEEP as tolerated ?-VAP/VENT bundle implementation ?-will NOT perform SAT/SBT when respiratory parameters are met ? ?FiO2 (%):  [100 %] 100 % ? ?ACUTE PTX ?Will need Chest tube ? ?CARDIAC FAILURE-acute NSTEMI ?-oxygen as needed ?Pressors, vent support ?-follow up cardiac enzymes as indicated ?Check ECHO ? ? ? ?CARDIAC ?ICU monitoring ? ? ?ACUTE KIDNEY INJURY/Renal Failure ?-continue Foley Catheter-assess need ?-Avoid nephrotoxic agents ?-Follow urine output, BMP ?-Ensure adequate renal perfusion, optimize oxygenation ?-Renal dose medications ? ?No intake or output data in the 24 hours ending 08/08/2021 1329 ? ? ?NEUROLOGY ?Acute toxic metabolic encephalopathy, need for sedation ?Await Ct head ? ?CARDIOGENIC SHOCK ?-use vasopressors to keep MAP>65 as needed ?-follow ABG and LA ?-follow up cultures ? ? ?ENDO ?- ICU hypoglycemic\Hyperglycemia protocol ?-check FSBS per protocol ? ? ?  GI ?GI PROPHYLAXIS as indicated ? ?NUTRITIONAL STATUS ?DIET--NPO ?Constipation protocol as indicated ? ? ?ELECTROLYTES ?-follow labs as needed ?-replace as needed ?-pharmacy consultation and following ? ? ?ACUTE ANEMIA- ?TRANSFUSE AS NEEDED ?CONSIDER TRANSFUSION  IF HGB<7 ?DVT PRX  with TED/SCD's ONLY ? ? ? ? ?Best practice (right click and "Reselect all SmartList Selections" daily)  ?Diet: NPO ?Pain/Anxiety/Delirium protocol (if indicated): Yes (RASS goal 0) ?VAP protocol (if indicated): Yes ?DVT prophylaxis: Subcutaneous Heparin ?GI prophylaxis: PPI ?Mobility:  bed rest  ?Code Status:  FULL ?Disposition:ICU ? ?Labs   ?CBC: ?Recent Labs  ?Lab 08/14/2021 ?1118  ?WBC 10.8*  ?HGB 13.6  ?HCT 43.3  ?MCV 91.7  ?PLT 219  ? ? ?Basic Metabolic Panel: ?Recent Labs  ?Lab 07/29/2021 ?1118  ?NA 137  ?K 4.2  ?CL 102  ?CO2 22  ?GLUCOSE 330*  ?BUN 28*  ?CREATININE 1.76*  ?CALCIUM 8.5*  ? ?GFR: ?Estimated Creatinine Clearance: 52.2 mL/min (A) (by C-G formula based on SCr of 1.76 mg/dL (H)). ?Recent Labs  ?Lab 07/30/2021 ?1118  ?WBC 10.8*  ? ? ?Liver Function Tests: ?No results for input(s): AST, ALT, ALKPHOS, BILITOT, PROT, ALBUMIN in the last 168 hours. ?No results for input(s): LIPASE, AMYLASE in the last 168 hours. ?No results for input(s): AMMONIA in the last 168 hours. ? ?ABG ?No results found for: PHART, PCO2ART, PO2ART, HCO3, TCO2, ACIDBASEDEF, O2SAT  ? ?Coagulation Profile: ?No results for input(s): INR, PROTIME in the last 168 hours. ? ?Cardiac Enzymes: ?No results for input(s): CKTOTAL, CKMB, CKMBINDEX, TROPONINI in the last 168 hours. ? ?HbA1C: ?No results found for: HGBA1C ? ?CBG: ?Recent Labs  ?Lab 08/08/2021 ?1121  ?GLUCAP 198*  ? ? ? ?Past Medical History:  ?He,  has a past medical history of GERD (gastroesophageal reflux disease), Hyperlipidemia, and Hypertension.  ? ?Surgical History:  ? ?Past Surgical History:  ?Procedure Laterality Date  ? NO PAST SURGERIES    ?  ? ?Social History:  ? reports that he has never smoked. He has never been exposed to tobacco smoke. He has never used smokeless tobacco. He reports that he does not drink alcohol and does not use drugs.  ? ?Family History:  ?His family history includes Heart disease in his father; Liver cancer in his father.  ? ?Allergies ?No Known  Allergies  ? ?Home Medications  ?Prior to Admission medications   ?Medication Sig Start Date End Date Taking? Authorizing Provider  ?Testosterone 20.25 MG/1.25GM (1.62%) GEL Place 2 Act onto the skin daily. 06/30/21   Zara Council A, PA-C  ?amLODipine (NORVASC) 10 MG tablet Take 1 tablet (10 mg total) by mouth daily. 04/29/21   Glean Hess, MD  ?atorvastatin (LIPITOR) 10 MG tablet Take 1 tablet (10 mg total) by mouth daily. 04/29/21   Glean Hess, MD  ?esomeprazole (NEXIUM) 20 MG capsule Take 1 tablet by mouth daily. OTC    [provider]  ?tadalafil (CIALIS) 20 MG tablet Take 1 tablet (20 mg total) by mouth daily as needed for erectile dysfunction. 06/23/21   Zara Council A, PA-C  ?temazepam (RESTORIL) 15 MG capsule Take 1 capsule (15 mg total) by mouth at bedtime as needed for sleep. 06/23/21   Nori Riis, PA-C  ?  ? ? ? ?DVT/GI PRX  assessed ?I Assessed the need for Labs ?I Assessed the need for Foley ?I Assessed the need for Central Venous Line ?Family Discussion when available ?I Assessed the need for Mobilization ?I made an Assessment of medications to  be adjusted accordingly ?Safety Risk assessment completed ? ?CASE DISCUSSED IN MULTIDISCIPLINARY ROUNDS WITH ICU TEAM ? ? ? ? ?Critical Care Time devoted to patient care services described in this note is 75 minutes.  ? ?Critical care was necessary to treat /prevent imminent and life-threatening deterioration. ? ? ?PATIENT WITH VERY POOR PROGNOSIS ?I ANTICIPATE PROLONGED ICU LOS ? ?Patient is critically ill. Patient with Multiorgan failure and at high risk for cardiac arrest and death.  ? ? ?Corrin Parker, M.D.  ?[facility]  ?Medical Director [place] ?Medical Director [facility]-P[facility] Department  ? ? ? ? ? ? ?

## 2021-08-04 NOTE — Progress Notes (Signed)
?   07/27/2021 1200  ?Clinical Encounter Type  ?Visited With Family  ?Visit Type ED;Code  ? ?Chaplain encountered family of loved one receiving chest compressions. Chaplain provided support to family members and church family who were in ED waiting. ?

## 2021-08-04 NOTE — Plan of Care (Signed)
Goals of Care ? ?Family had been awaiting the arrival of additional family members prior to transitioning to comfort measures.  ?Adam Coffey, the patient's wife, alerted the team that she was ready to transition.  ? ?Patient remains unresponsive and will not open eyes to command post cardiac arrest. 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 Patient is in the Dying  Process associated with Suffering. ? ?Family understands the situation. ? ?They have consented and agreed to proceed with Comfort care measures. Patient extubated with wife and family bedside. ? ? ?Cheryll Cockayne Rust-Chester, AGACNP-BC ?[facility]  ? ? ?Please see Amion for pager details.  ? ? ?

## 2021-08-04 NOTE — Progress Notes (Signed)
Pt was transported to CT and back to the ED while on the vent. The pt was transported to The CCU from the ED while on the vent. ?

## 2021-08-04 NOTE — Progress Notes (Signed)
Pharmacy Antibiotic Note ? ?Adam Coffey is a 66 y.o. male admitted on 07/25/2021 with a cardiac arrest.  Pharmacy has been consulted for Zosyn dosing to treat PNA. ? ?Plan: start Zosyn 3.375g IV q8h (4 hour infusion). ? ?Height: 5\' 10"  (177.8 cm) ?Weight: 111.1 kg (245 lb) ?IBW/kg (Calculated) : 73 ? ?Temp (24hrs), Avg:97 ?F (36.1 ?C), Min:97 ?F (36.1 ?C), Max:97 ?F (36.1 ?C) ? ?Recent Labs  ?Lab 07/21/2021 ?1118  ?WBC 10.8*  ?CREATININE 1.76*  ?  ?Estimated Creatinine Clearance: 52.2 mL/min (A) (by C-G formula based on SCr of 1.76 mg/dL (H)).   ? ?No Known Allergies ? ?Antimicrobials this admission: ?04/17 Zosyn >>  ? ?Microbiology results: ?04/17 BCx: pending ?04/17 UCx: pending  ?04/17 Sputum: pending  ? ? ?Thank you for allowing pharmacy to be a part of this patient?s care. ? ?Dallie Piles ?07/27/2021 2:47 PM ? ?

## 2021-08-04 NOTE — Consult Note (Signed)
?Cardiology Consultation:  ? ?Patient ID: Adam Coffey ?MRN: [id number]; DOB: 10-29-1955 ? ?Admit date: 08/02/2021 ?Date of Consult: 07/20/2021 ? ?PCP:  Glean Hess, MD ?  ?[facility] Providers ?Cardiologist:  None   { ? ? ? ?Patient Profile:  ? ?Adam Coffey is a 66 y.o. male with a hx of essential hypertension, hyperlipidemia and GERD who is being seen 08/03/2021 for the evaluation of cardiac arrest at the request of Dr. Ellender Hose. ? ?History of Present Illness:  ? ?Mr. Karstetter is a 66 year old male with no prior cardiac history.  He was reportedly outside doing some light yard work when he started feeling nauseous followed by vomiting and unresponsiveness.  Bystander CPR was initiated and EMS arrived fairly quickly.  The patient was found to have shockable rhythm twice and since then continued to be in PEA and asystole.  CPR was in progress upon arrival to the ED after about 50 minutes.  CPR was continued in the ED for at least another 20 minutes followed by ROSC but the patient required epinephrine drip.  Initial EKG showed global ST depression with some ST elevation in aVR.  Subsequently, these ST changes almost completely resolved.  Given prolonged CPR, I felt that the patient was not a candidate for emergent cardiac catheterization. ?Patient was subsequently admitted to the ICU.  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  CT chest showed large pneumothorax on the left side with rib fractures likely from CPR.  Family requested no aggressive measures based on these findings. ? ? ?Past Medical History:  ?Diagnosis Date  ? GERD (gastroesophageal reflux disease)   ? Hyperlipidemia   ? Hypertension   ? ? ?Past Surgical History:  ?Procedure Laterality Date  ? NO PAST SURGERIES    ?  ? ?Home Medications:  ?Prior to Admission medications   ?Medication Sig Start Date End Date Taking? Authorizing Provider  ?amLODipine (NORVASC) 10 MG tablet Take 1 tablet (10 mg total) by mouth  daily. 04/29/21  Yes Glean Hess, MD  ?atorvastatin (LIPITOR) 10 MG tablet Take 1 tablet (10 mg total) by mouth daily. 04/29/21  Yes Glean Hess, MD  ?Esomeprazole Magnesium 20 MG TBEC Take 20 mg by mouth daily.   Yes [provider]  ?tadalafil (CIALIS) 20 MG tablet Take 1 tablet (20 mg total) by mouth daily as needed for erectile dysfunction. 06/23/21  Yes McGowan, Larene Beach A, PA-C  ?temazepam (RESTORIL) 15 MG capsule Take 1 capsule (15 mg total) by mouth at bedtime as needed for sleep. 06/23/21  Yes McGowan, Hunt Oris, PA-C  ?Testosterone 20.25 MG/1.25GM (1.62%) GEL Place 2 Act onto the skin daily. 06/30/21  Yes McGowan, Hunt Oris, PA-C  ? ? ?Inpatient Medications: ?Scheduled Meds: ? heparin injection (subcutaneous)  5,000 Units Subcutaneous Q8H  ? pantoprazole (PROTONIX) IV  40 mg Intravenous Daily  ? sodium chloride flush  3 mL Intravenous Q12H  ? Tdap  0.5 mL Intramuscular Once  ? ?Continuous Infusions: ? sodium chloride    ? epinephrine 30 mcg/min (08/15/2021 1710)  ? famotidine (PEPCID) IV    ? levETIRAcetam    ? norepinephrine (LEVOPHED) Adult infusion Stopped (08/08/2021 1240)  ? piperacillin-tazobactam (ZOSYN)  IV    ? ?PRN Meds: ?sodium chloride, acetaminophen, docusate sodium, ondansetron (ZOFRAN) IV, polyethylene glycol, sodium chloride flush ? ?Allergies:   No Known Allergies ? ?Social History:   ?Social History  ? ?Socioeconomic History  ? Marital status: Married  ?  Spouse name: Harvey Maxon  ?  Number of children: 2  ? Years of education: 45  ? Highest education level: Bachelor's degree (e.g., BA, AB, BS)  ?Occupational History  ? Occupation: Midwife  ?Tobacco Use  ? Smoking status: Never  ?  Passive exposure: Never  ? Smokeless tobacco: Never  ?Vaping Use  ? Vaping Use: Never used  ?Substance and Sexual Activity  ? Alcohol use: No  ?  Alcohol/week: 0.0 standard drinks  ? Drug use: No  ? Sexual activity: Yes  ?Other Topics Concern  ? Not on file  ?Social History Narrative  ? Not on  file  ? ?Social Determinants of Health  ? ?Financial Resource Strain: Not on file  ?Food Insecurity: Not on file  ?Transportation Needs: Not on file  ?Physical Activity: Not on file  ?Stress: Not on file  ?Social Connections: Not on file  ?Intimate Partner Violence: Not on file  ?  ?Family History:   ? ?Family History  ?Problem Relation Age of Onset  ? Heart disease Father   ? Liver cancer Father   ?  ? ?ROS:  ?Please see the history of present illness.  ? ?All other ROS reviewed and negative.    ? ?Physical Exam/Data:  ? ?Vitals:  ? 07/19/2021 1425 08/03/2021 1430 08/01/2021 1500 08/16/2021 1600  ?BP: 119/65 123/77 135/73 (!) 151/80  ?Pulse: 65 71 (!) 50 76  ?Resp: (!) 27 (!) 28 (!) 26 (!) 25  ?Temp: 97.7 ?F (36.5 ?C)   (!) 97.5 ?F (36.4 ?C)  ?TempSrc: Bladder   Bladder  ?SpO2: 98% 99% 99% 99%  ?Weight: 113.6 kg     ?Height:      ? ? ?Intake/Output Summary (Last 24 hours) at 07/24/2021 1824 ?Last data filed at 07/31/2021 1600 ?Gross per 24 hour  ?Intake 321.88 ml  ?Output --  ?Net 321.88 ml  ? ? ?  07/27/2021  ?  2:25 PM 07/20/2021  ? 11:55 AM 06/23/2021  ?  3:40 PM  ?Last 3 Weights  ?Weight (lbs) 250 lb 7.1 oz 245 lb 240 lb  ?Weight (kg) 113.6 kg 111.131 kg 108.863 kg  ?   ?Body mass index is 35.93 kg/m?.  ?General:   Intubated and sedated and not responsive. ?HEENT: normal ?Neck: no JVD ?Vascular: No carotid bruits; Distal pulses 2+ bilaterally ?Cardiac:  normal S1, S2; RRR; no murmur  ?Lungs:  clear to auscultation bilaterally, no wheezing, rhonchi or rales  ?Abd: soft, nontender, no hepatomegaly  ?Ext: no edema ?Musculoskeletal:  No deformities, BUE and BLE strength normal and equal ?Skin: warm and dry  ?Neuro:  CNs 2-12 intact, no focal abnormalities noted ?Psych:  Normal affect  ? ?EKG:  The EKG was personally reviewed and demonstrates: Sinus rhythm with diffuse ST depression on almost all leads except some ST elevation in aVR. ?Telemetry:  Telemetry was personally reviewed and demonstrates:   ? ?Relevant CV  Studies: ? ? ?Laboratory Data: ? ?High Sensitivity Troponin:   ?Recent Labs  ?Lab 08/14/2021 ?1118  ?TROPONINIHS 50*  ?   ?Chemistry ?Recent Labs  ?Lab 08/03/2021 ?1118  ?NA 137  ?K 4.2  ?CL 102  ?CO2 22  ?GLUCOSE 330*  ?BUN 28*  ?CREATININE 1.76*  ?CALCIUM 8.5*  ?MG 2.7*  ?GFRNONAA 42*  ?ANIONGAP 13  ?  ?No results for input(s): PROT, ALBUMIN, AST, ALT, ALKPHOS, BILITOT in the last 168 hours. ?Lipids No results for input(s): CHOL, TRIG, HDL, LABVLDL, LDLCALC, CHOLHDL in the last 168 hours.  ?Hematology ?Recent Labs  ?Lab 08/14/2021 ?1118  ?  WBC 10.8*  ?RBC 4.72  ?HGB 13.6  ?HCT 43.3  ?MCV 91.7  ?MCH 28.8  ?MCHC 31.4  ?RDW 13.2  ?PLT 219  ? ?Thyroid No results for input(s): TSH, FREET4 in the last 168 hours.  ?BNP ?Recent Labs  ?Lab 08/15/2021 ?1118  ?BNP 79.0  ?  ?DDimer No results for input(s): DDIMER in the last 168 hours. ? ? ?Radiology/Studies:  ?CT HEAD WO CONTRAST (5MM) ? ?Result Date: 08/01/2021 ?CLINICAL DATA:  Head trauma EXAM: CT HEAD WITHOUT CONTRAST CT CERVICAL SPINE WITHOUT CONTRAST TECHNIQUE: Multidetector CT imaging of the head and cervical spine was performed following the standard protocol without intravenous contrast. Multiplanar CT image reconstructions of the cervical spine were also generated. RADIATION DOSE REDUCTION: This exam was performed according to the departmental dose-optimization program which includes automated exposure control, adjustment of the mA and/or kV according to patient size and/or use of iterative reconstruction technique. COMPARISON:  None. FINDINGS: CT HEAD FINDINGS Brain: Ventricles are normal in size and configuration. No parenchymal or extra-axial hemorrhage. Poorly delineated structures of the bilateral basal ganglia, RIGHT greater than LEFT, suspicious for acute obscuring edema. Vascular: Chronic calcified atherosclerotic changes of the large vessels at the skull base. No unexpected hyperdense vessel. Skull: Normal. Negative for fracture or focal lesion. Sinuses/Orbits: No  acute finding. Other: None. CT CERVICAL SPINE FINDINGS Alignment: Mild dextroscoliosis. No evidence of acute vertebral body subluxation. Skull base and vertebrae: No fracture line or displaced fracture fragment identifie

## 2021-08-04 NOTE — ED Triage Notes (Signed)
Pt BIBA for cardiac arrest. Pt witnessed to become dizzy, vomit, pass out. CPR at scene. First responder 2 shocks on AED, then PEA for transport with 4 epi IVP given. CPR in progress on arrival ?

## 2021-08-04 NOTE — Code Documentation (Signed)
Family at beside. Family given emotional support. 

## 2021-08-04 NOTE — ED Notes (Signed)
MD Kasa at bedside ?

## 2021-08-04 NOTE — Plan of Care (Signed)
Titrating epi gtt to maintain MAP>65; pt continues to have increased pressor requirements. RASS -5, PERRLA @3  w/sluggish reaction, no movement to painful stimuli on all extremities, no cough reflex, no gag reflex. Family family pastor, and [place] family support coordinator at bedside to discuss plan of care. Decision was made to initiate comfort measures after out-of-state family arrived. ~2335 pt terminally extubated by RT; NP Rust[place]Chester at bedside. PRN morphine given x1. Time of death declared by Kathrynn Ducking, RN and Madelon Lips, RN @2355 .  ?Problem: Education: ?Goal: Knowledge of General Education information will improve ?Description: Including pain rating scale, medication(s)/side effects and non-pharmacologic comfort measures ?Outcome: Progressing ?  ?Problem: Health Behavior/Discharge Planning: ?Goal: Ability to manage health-related needs will improve ?Outcome: Progressing ?  ?Problem: Clinical Measurements: ?Goal: Ability to maintain clinical measurements within normal limits will improve ?Outcome: Progressing ?Goal: Will remain free from infection ?Outcome: Progressing ?Goal: Diagnostic test results will improve ?Outcome: Progressing ?Goal: Respiratory complications will improve ?Outcome: Progressing ?Goal: Cardiovascular complication will be avoided ?Outcome: Progressing ?  ?Problem: Activity: ?Goal: Risk for activity intolerance will decrease ?Outcome: Progressing ?  ?Problem: Nutrition: ?Goal: Adequate nutrition will be maintained ?Outcome: Progressing ?  ?Problem: Coping: ?Goal: Level of anxiety will decrease ?Outcome: Progressing ?  ?Problem: Elimination: ?Goal: Will not experience complications related to bowel motility ?Outcome: Progressing ?Goal: Will not experience complications related to urinary retention ?Outcome: Progressing ?  ?Problem: Pain Managment: ?Goal: General experience of comfort will improve ?Outcome: Progressing ?  ?Problem: Safety: ?Goal: Ability to remain free from  injury will improve ?Outcome: Progressing ?  ?Problem: Skin Integrity: ?Goal: Risk for impaired skin integrity will decrease ?Outcome: Progressing ?  ?

## 2021-08-04 NOTE — ED Notes (Signed)
Patient transported to CT 

## 2021-08-04 NOTE — Progress Notes (Signed)
Pt family refusing EEG and further testing at this time d/t goals of care discussion and probable transition to comfort care once other family members have arrived to bedside. RN notified MD Belia Heman, MD aware.  ?

## 2021-08-04 NOTE — Plan of Care (Signed)
? ?  Interdisciplinary Goals of Care Family Meeting ? ? ?Date carried out: 07/29/2021 ? ?Location of the meeting: Bedside ? ?Member's involved: Physician, Nurse Practitioner, Bedside Registered Nurse, and Family Member or next of kin ? ?Code status: Full DNR ? ?Disposition: Continue current acute care ? ? ? ? ?GOALS OF CARE DISCUSSION ? ?The Clinical status was relayed to family in detail- ? ?Updated and notified of patients medical condition- ?Patient remains unresponsive and will not open eyes to command.   ?Patient is having a weak cough and struggling to remove secretions.   ?Patient with increased WOB and using accessory muscles to breathe ?Explained to family course of therapy and the modalities  ? ?Patient with Progressive multiorgan failure with a very high probablity of a very minimal chance of meaningful recovery despite all aggressive and optimal medical therapy.  ? ? ?Family understands the situation. ? ?They have consented and agreed to DNR status ?I have reviewed images with family and CT head shows severe swelling and edema of the brain along with severe LEFT sided PTX ? ?AT this time, I have relayed to family that patient may herniate and die in next 24 hrs. Wife has relayed to me that if his quality of life has changed, then she would NOT want invasive catheters or chest tubes at this time.  ? ?I will discuss case with Neurology  ? ?We will make patient DNR status as per family ?Continue pressors and vent ?will implement end of life visitation policy.  ? ? ? ?Family are satisfied with Plan of action and management. All questions answered ? ?Additional CC time 45 mins ? ? ?Lucie Leather, M.D.  ?[facility]  ?Medical Director [facility]  ?Medical Director [facility] Cardio-Pulmonary Department  ? ? ?

## 2021-08-04 NOTE — Progress Notes (Signed)
?   08/15/2021 1600  ?Clinical Encounter Type  ?Visited With Patient and family together  ?Visit Type Initial  ?Referral From Nurse  ?Consult/Referral To Chaplain  ? ?Chaplain responded to nurse consult. Chaplain provided compassionate presence and reflective listening as family spoke about patient's life. Chaplain provided hospitality and prayer during EOL journey. Family appreciated Chaplain visit. On call Chaplain will follow up. ?

## 2021-08-04 NOTE — ED Provider Notes (Signed)
? ?[facility] ?Provider Note ? ? ? Event Date/Time  ? First MD Initiated Contact with Patient 08/08/2021 1148   ?  (approximate) ? ? ?History  ? ?Cardiac Arrest ? ? ?HPI ? ?Adam Coffey is a 66 y.o. male here with cardiac arrest.  Patient reportedly was outside of his house doing some light yard work.  He reportedly began to feel nauseous, felt like he was going to vomit, then became unresponsive.  Bystander CPR was initiated and fire department was there fairly quickly.  Patient reportedly was in a shockable rhythm x2, then has since lost pulses and a PEA then asystole arrest in route.  Patient reportedly has history of high blood pressure but otherwise no known significant history.  Is unsure whether he had chest pain.  CPR in progress on arrival. ? ?Patient received multiple doses of epinephrine prior to arrival. ?  ? ? ?Physical Exam  ? ?Triage Vital Signs: ?ED Triage Vitals  ?Enc Vitals Group  ?   BP 08/02/2021 1111 126/73  ?   Pulse Rate 08/17/2021 1111 85  ?   Resp 07/20/2021 1111 20  ?   Temp --   ?   Temp src --   ?   SpO2 07/21/2021 1109 (!) 58 %  ?   Weight 08/02/2021 1155 245 lb (111.1 kg)  ?   Height 08/03/2021 1155 5\' 10"  (1.778 m)  ?   Head Circumference --   ?   Peak Flow --   ?   Pain Score --   ?   Pain Loc --   ?   Pain Edu? --   ?   Excl. in [place]? --   ? ? ?Most recent vital signs: ?Vitals:  ? 08/10/2021 1430 07/31/2021 1500  ?BP: 123/77 135/73  ?Pulse: 71 (!) 50  ?Resp: (!) 28 (!) 26  ?Temp:    ?SpO2: 99% 99%  ? ? ? ?General: Unresponsive, CPR in progress ?CV:  No palpable pulses, cyanotic ?Resp:  No spontaneous respirations noted ?Abd:  No distention. ?Other:  Superficial abrasion/laceration to the forehead, evidence of trauma from fall.  Blood in the oropharynx.  No corneal reflex noted.  No gag reflex noted at time of i intubation ? ? ?ED Results / Procedures / Treatments  ? ?Labs ?(all labs ordered are listed, but only abnormal results are displayed) ?Labs Reviewed  ?BASIC  METABOLIC PANEL - Abnormal; Notable for the following components:  ?    Result Value  ? Glucose, Bld 330 (*)   ? BUN 28 (*)   ? Creatinine, Ser 1.76 (*)   ? Calcium 8.5 (*)   ? GFR, Estimated 42 (*)   ? All other components within normal limits  ?CBC - Abnormal; Notable for the following components:  ? WBC 10.8 (*)   ? All other components within normal limits  ?MAGNESIUM - Abnormal; Notable for the following components:  ? Magnesium 2.7 (*)   ? All other components within normal limits  ?URINALYSIS, ROUTINE W REFLEX MICROSCOPIC - Abnormal; Notable for the following components:  ? Color, Urine YELLOW (*)   ? APPearance HAZY (*)   ? Hgb urine dipstick SMALL (*)   ? Protein, ur 30 (*)   ? Leukocytes,Ua MODERATE (*)   ? Bacteria, UA RARE (*)   ? All other components within normal limits  ?CBG MONITORING, ED - Abnormal; Notable for the following components:  ? Glucose-Capillary 198 (*)   ? All other components  within normal limits  ?TROPONIN I (HIGH SENSITIVITY) - Abnormal; Notable for the following components:  ? Troponin I (High Sensitivity) 50 (*)   ? All other components within normal limits  ?RESP PANEL BY RT-PCR (FLU A&B, COVID) ARPGX2  ?URINE CULTURE  ?CULTURE, BLOOD (ROUTINE X 2)  ?CULTURE, BLOOD (ROUTINE X 2)  ?MRSA NEXT GEN BY PCR, NASAL  ?BRAIN NATRIURETIC PEPTIDE  ?URINE DRUG SCREEN, QUALITATIVE ([facility] ONLY)  ?HIV ANTIBODY (ROUTINE TESTING W REFLEX)  ?LACTIC ACID, PLASMA  ?LACTIC ACID, PLASMA  ?PROCALCITONIN  ?SAMPLE TO BLOOD BANK  ?TROPONIN I (HIGH SENSITIVITY)  ? ? ? ?EKG ?EKG 1: Heart rate 76, PR 184, QRS 22, QTc 529.  JuNctional rhythm versus second-degree AV block with what appears to be some elevation in aVL and aVR, though does not meet STEMI criteria. ? ? ?RADIOLOGY ?CXR: ET tube in appropriate position ? ? ?I also independently reviewed and agree wit radiologist interpretations. ? ? ?PROCEDURES: ? ?Critical Care performed: Yes, see critical care procedure note(s) ? ?.Critical Care ?Performed by:  Duffy Bruce, MD ?Authorized by: Duffy Bruce, MD  ? ?Critical care provider statement:  ?  Critical care time (minutes):  30 ?  Critical care time was exclusive of:  Separately billable procedures and treating other patients ?  Critical care was necessary to treat or prevent imminent or life-threatening deterioration of the following conditions:  Cardiac failure, circulatory failure and respiratory failure ?  Critical care was time spent personally by me on the following activities:  Development of treatment plan with patient or surrogate, discussions with consultants, evaluation of patient's response to treatment, examination of patient, ordering and review of laboratory studies, ordering and review of radiographic studies, ordering and performing treatments and interventions, pulse oximetry, re-evaluation of patient's condition and review of old charts ?  I assumed direction of critical care for this patient from another provider in my specialty: no   ?  Care discussed with: admitting provider   ?Procedure Name: Intubation ?Date/Time: 07/25/2021 3:56 PM ?Performed by: Duffy Bruce, MD ?Pre-anesthesia Checklist: Patient identified ?Laryngoscope Size: Glidescope and 4 ?Grade View: Grade I ?Tube size: 8.0 mm ?Number of attempts: 1 ?Airway Equipment and Method: Rigid stylet ?Placement Confirmation: ETT inserted through vocal cords under direct vision ?Secured at: 23 cm ?Tube secured with: ETT holder ?Dental Injury: Teeth and Oropharynx as per pre-operative assessment  ?Difficulty Due To: Difficulty was unanticipated ? ? ? ? ? ? ?MEDICATIONS ORDERED IN ED: ?Medications  ?EPINEPHrine (ADRENALIN) 5 mg in NS 250 mL (0.02 mg/mL) premix infusion (30 mcg/min Intravenous New Bag/Given 07/29/2021 1420)  ?Tdap (BOOSTRIX) injection 0.5 mL (0.5 mLs Intramuscular Patient Refused/Not Given 08/01/2021 1500)  ?norepinephrine (LEVOPHED) 4mg  in 259mL (0.016 mg/mL) premix infusion (0 mcg/min Intravenous Stopped 08/01/2021 1240)  ?sodium  chloride flush (NS) 0.9 % injection 3 mL (3 mLs Intravenous Patient Refused/Not Given 07/31/2021 1513)  ?sodium chloride flush (NS) 0.9 % injection 3 mL (has no administration in time range)  ?0.9 %  sodium chloride infusion (has no administration in time range)  ?acetaminophen (TYLENOL) tablet 650 mg (has no administration in time range)  ?docusate sodium (COLACE) capsule 100 mg (has no administration in time range)  ?polyethylene glycol (MIRALAX / GLYCOLAX) packet 17 g (has no administration in time range)  ?ondansetron (ZOFRAN) injection 4 mg (has no administration in time range)  ?famotidine (PEPCID) IVPB 20 mg premix (20 mg Intravenous Patient Refused/Not Given 08/03/2021 1511)  ?levETIRAcetam (KEPPRA) IVPB 500 mg/100 mL premix (500 mg Intravenous Patient  Refused/Not Given 08/02/2021 1512)  ?pantoprazole (PROTONIX) injection 40 mg (40 mg Intravenous Patient Refused/Not Given 08/12/2021 1512)  ?heparin injection 5,000 Units (5,000 Units Subcutaneous Patient Refused/Not Given 07/22/2021 1501)  ?piperacillin-tazobactam (ZOSYN) IVPB 3.375 g (3.375 g Intravenous Patient Refused/Not Given 07/27/2021 1512)  ?sodium chloride 0.9 % bolus 1,000 mL (0 mLs Intravenous Stopped 08/11/2021 1329)  ?EPINEPHrine (ADRENALIN) 1 MG/10ML injection (1 mg Intravenous Given 07/28/2021 1138)  ?atropine injection (1 mg Intravenous Given 08/03/2021 1124)  ?sodium bicarbonate injection (50 mEq Intravenous Given 07/27/2021 1141)  ?0.9 %  sodium chloride infusion (999 mL/hr Intravenous New Bag/Given 08/08/2021 1143)  ?calcium gluconate 1 g/ 50 mL sodium chloride IVPB (0 mg Intravenous Stopped 07/26/2021 1300)  ?amiodarone (NEXTERONE) IV bolus only 150 mg/100 mL (0 mg Intravenous Stopped 08/12/2021 1140)  ?iohexol (OMNIPAQUE) 350 MG/ML injection 50 mL (50 mLs Intravenous Contrast Given 08/02/2021 1300)  ? ? ? ?IMPRESSION / MDM / ASSESSMENT AND PLAN / ED COURSE  ?I reviewed the triage vital signs and the nursing notes. ?             ?               ? ? ?The patient is on the cardiac  monitor to evaluate for evidence of arrhythmia and/or significant heart rate changes. ? ? ?MDM:  ?66 year old male here with cardiac arrest.  Given the acuity of onset with absence of other significant risk fac

## 2021-08-04 NOTE — Procedures (Incomplete)
Patient Name: Adam Coffey [facility]  ?MRN: [id number]  ?Epilepsy Attending: Charlsie Quest  ?Referring Physician/Provider: Judithe Modest, NP ?Date: 09/01/21 ?Duration:  ? ?Patient history: 66yo M s/p cardiac arrest. EEG to evaluate for seizure ? ?Level of alertness: Awake, drowsy, sleep, comatose, lethargic *** ? ?AEDs during EEG study: LEV ? ?Technical aspects: This EEG study was done with scalp electrodes positioned according to the 10-20 International system of electrode placement. Electrical activity was acquired at a sampling rate of 500Hz  and reviewed with a high frequency filter of 70Hz  and a low frequency filter of 1Hz . EEG data were recorded continuously and digitally stored.  ? ?Description: The posterior dominant rhythm consists of 9-10 Hz activity of moderate voltage (25-35 uV) seen predominantly in posterior head regions, symmetric and reactive to eye opening and eye closing. Drowsiness was characterized by attenuation of the posterior background rhythm. Sleep was characterized by vertex waves, sleep spindles (12 to 14 Hz), maximal frontocentral region.  There is an excessive amount of 15 to 18 Hz, 2-3 uV beta activity with irregular morphology distributed symmetrically and diffusely.  ? ?EEG showed continuous/intermittent generalized polymorphic sharply contoured 3 to 6 Hz theta-delta slowing. ? ?EEG showed generalized periodic discharges with triphasic morphology at  Hz, more prominent when awake/stimulated. ? ?Generalized Spike/Polyspikes/Sharp waves were noted in left/right frontal/temporal/parietal/occipital region.  ? ?Seizure was noted arising from left/right frontal/temporal/parietal/occipital region.  During seizure, patient was noted to.  Onset of seizure, total duration ? ?Event button was pressed on at for .  Concomitant EEG before, during and after the event showed normal posterior dominant rhythm, did not show any EEG changes suggest seizure. ? ?Hyperventilation did not show any EEG  change.  Physiologic photic driving was not seen during photic stimulation.  Hyperventilation and photic stimulation were not performed.    ? ?ABNORMALITY ?-Sharp wave, generalized, left/right frontal/temporal/parietal/occipital region.  ?-Spike,generalized, left/right frontal/temporal/parietal/occipital region.  ?-Polyspikes, generalized, left/right frontal/temporal/parietal/occipital region.  ?- Lateralized periodic discharges with overriding fast activity ( LPD +) left/right, maximal frontal/temporal/parietal/occipital region ?- Periodic discharges with triphasic morphology, generalized ( GPDs) ?- Intermittent slow, generalized ?- Continuous slow, generalized ?- Excessive beta, generalized ? ? ? ?IMPRESSION: ?This study is within normal limits.  ?This study is suggestive of mild/moderate/severe diffuse encephalopathy, nonspecific etiology but likely related to sedation, toxic-metabolic etiology, anoxic/hypoxic brain injury ?This study is suggestive of cortical dysfunction arising from left/right frontal/temporal/parietal/occipital region, nonspecific etiology, likely secondary to underlying structural abnormality ?This study showed evidence of potential epileptogenicity arising from ?This study showed seizures arising from ?No seizures or epileptiform discharges were seen throughout the recording. ?However, only wakefulness and drowsiness were recorded. If suspicion for interictal activity remains a concern, a prolonged study including sleep should be considered.  ?The excessive beta activity seen in the background is most likely due to the effect of benzodiazepine and is a benign EEG pattern. ? ? ?Renwick Asman  ? ?

## 2021-08-05 LAB — URINE CULTURE: Culture: NO GROWTH

## 2021-08-05 LAB — GLUCOSE, CAPILLARY: Glucose-Capillary: 248 mg/dL — ABNORMAL HIGH (ref 70–99)

## 2021-08-05 SURGERY — SURGICAL PROCUREMENT, ORGAN
Anesthesia: Choice

## 2021-08-18 NOTE — Death Summary Note (Signed)
?DEATH SUMMARY  ? ?Patient Details  ?Name: [facility] ?MRN: [id number] ?DOB: Aug 11, 1955 ? ?Admission/Discharge Information  ? ?Admit Date:  08/06/2021  ?Date of Death: Date of Death: 07/31/2021  ?Time of Death: Time of Death: 2355  ?Length of Stay: 0  ?Referring Physician: Reubin MilanBerglund, Laura H, MD  ? ?Reason(s) for Hospitalization  ?Out of hospital cardiac arrest with bystander CPR ? ?Diagnoses  ?Preliminary cause of death: Ischemic Cardiomyopathy ?Secondary Diagnoses (including complications and co-morbidities):  ?Principal Problem: ?  Cardiogenic shock (HCC) ?Active Problems: ?  Cardiac arrest, cause unspecified (HCC) ?  Ischemic cardiomyopathy ?  Pneumothorax on left ?  Rib fractures ?  Acute respiratory failure (HCC) ?  Acute non-ST elevation myocardial infarction (NSTEMI) (HCC) ?  AKI (acute kidney injury) (HCC) ?  Acute anoxic encephalopathy (HCC) ? ? ?Brief Hospital Course (including significant findings, care, treatment, and services provided and events leading to death)  ?Adam Coffey is a 66 y.o. year old male who presented to [facility] ED after an out of hospital cardiac arrest with bystander CPR. 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 Bystander CPR initiated and the fire department arrived fairly quickly. Patient was reportedly in a shockable rhythm x 2, then PEA then asystole rhythms arriving to the ED with CPR in progress. Down time estimated at over 50 minutes. No STE on 12 lead, cardiology recommended medical management. Patient lost pulses multiple times and was placed on epinephrine drip. CT chest revealed broken ribs and Left sided pneumothorax. CT head was concerning for anoxic injury, and patient observed to have myoclonic jerking at bedside assessment. Patient's status was reviewed with family in detail. Due to the patient's  grave prognosis and concern for anoxic injury, family decided not to escalate the patient's care and focus on comfort once all family had a chance to visit. The patient's wife declined chest tube/central line placement. Epinephrine drip was continued to help stabilize the patient while family visited from out of state. Once all family had visited, wife requested comfort measures. The patient passed away with his wife and family present bedside. ? ?Pertinent Labs and Studies  ?Significant Diagnostic Studies ?CT HEAD WO CONTRAST (5MM) ? ?Result Date: 07/24/2021 ?CLINICAL DATA:  Head trauma EXAM: CT HEAD WITHOUT CONTRAST CT CERVICAL SPINE WITHOUT CONTRAST TECHNIQUE: Multidetector CT imaging of the head and cervical spine was performed following the standard protocol without intravenous contrast. Multiplanar CT image reconstructions of the cervical spine were also generated. RADIATION DOSE REDUCTION: This exam was performed according to the departmental dose-optimization program which includes automated exposure control, adjustment of the mA and/or kV according to patient size and/or use of iterative reconstruction technique. COMPARISON:  None. FINDINGS: CT HEAD FINDINGS Brain: Ventricles are normal in size and configuration. No parenchymal or extra-axial hemorrhage. Poorly delineated structures of the bilateral basal ganglia, RIGHT greater than LEFT, suspicious for acute obscuring edema. Vascular: Chronic calcified atherosclerotic changes of the large vessels at the skull base. No unexpected hyperdense vessel. Skull: Normal. Negative for fracture or focal lesion. Sinuses/Orbits: No acute finding. Other: None. CT CERVICAL SPINE FINDINGS Alignment: Mild dextroscoliosis. No evidence of acute vertebral body subluxation. Skull base and vertebrae: No fracture line or displaced fracture fragment identified. Facet joints appear normally aligned throughout. Soft tissues and spinal canal: No visible canal hematoma. Endotracheal  tube in place. Disc levels: Mild degenerative spondylosis of the  mid and lower cervical spine. No more than mild central canal stenosis at any level. Upper chest: Endotracheal tube in place. Pneumothorax at the LEFT lung apex. Pulmonary edema/contusion within the RIGHT upper lung, incompletely imaged. Other: Bilateral carotid atherosclerosis. IMPRESSION: 1. 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 Recommend further characterization with brain MRI. 2. No intracranial hemorrhage.  No skull fracture. 3. No fracture or acute subluxation within the cervical spine. 4. Pneumothorax at the LEFT lung apex. Pulmonary edema/contusion within the RIGHT upper lung, incompletely imaged. Please see chest CT angiogram report for definitive characterization. 5. Bilateral carotid atherosclerosis. Critical Value/emergent results were called by telephone at the time of interpretation on 07/30/2021 at 1:33 pm to provider Duffy Bruce , who verbally acknowledged these results. Electronically Signed   By: Franki Cabot M.D.   On: 07/26/2021 13:35  ? ?CT Angio Chest PE W and/or Wo Contrast ? ?Result Date: 08/16/2021 ?CLINICAL DATA:  Status post cardiac arrest and resuscitation. High clinical probability for pulmonary embolism. EXAM: CT ANGIOGRAPHY CHEST WITH CONTRAST TECHNIQUE: Multidetector CT imaging of the chest was performed using the standard protocol during bolus administration of intravenous contrast. Multiplanar CT image reconstructions and MIPs were obtained to evaluate the vascular anatomy. RADIATION DOSE REDUCTION: This exam was performed according to the departmental dose-optimization program which includes automated exposure control, adjustment of the mA and/or kV according to patient size and/or use of iterative reconstruction technique. CONTRAST:  25mL OMNIPAQUE IOHEXOL 350 MG/ML SOLN COMPARISON:  None.  FINDINGS: Cardiovascular: Satisfactory opacification of pulmonary arteries noted, and no pulmonary emboli identified. No evidence of thoracic aortic dissection or aneurysm. Aortic and coronary atherosclerotic calcification noted. Mediastinum/Nodes: Endotracheal tube in appropriate position. No masses or pathologically enlarged lymph nodes identified. Lungs/Pleura: A large left pneumothorax is seen, with left lung atelectasis. Patchy areas of airspace disease are seen in the right upper and middle lobes, with background of ground-glass pulmonary opacity throughout the right lung. This may be due to pulmonary edema or aspiration. Upper abdomen: Small hiatal hernia noted. Hepatic and left renal cysts also noted. Musculoskeletal: Fractures of the left anterior 4th through 7th ribs are seen. Review of the MIP images confirms the above findings. IMPRESSION: No evidence of pulmonary embolism. Large left pneumothorax, with left lung atelectasis. Fractures of left anterior 4th through 7th ribs. Patchy areas of airspace disease in right upper and middle lobes, with background of ground-glass pulmonary opacity throughout the right lung, suspicious for pulmonary edema or aspiration. Small hiatal hernia. Aortic Atherosclerosis (ICD10-I70.0). Critical Value/emergent results were called by telephone at the time of interpretation on 07/19/2021 at 1:31 pm to the patient's ED nurse [facility], who verbally acknowledged these results. Electronically Signed   By: Marlaine Hind M.D.   On: 07/29/2021 13:43  ? ?CT Cervical Spine Wo Contrast ? ?Result Date: 07/25/2021 ?CLINICAL DATA:  Head trauma EXAM: CT HEAD WITHOUT CONTRAST CT CERVICAL SPINE WITHOUT CONTRAST TECHNIQUE: Multidetector CT imaging of the head and cervical spine was performed following the standard protocol without intravenous contrast. Multiplanar CT image reconstructions of the cervical spine were also generated. RADIATION DOSE REDUCTION: This exam was performed according to the  departmental dose-optimization program which includes automated exposure control, adjustment of the mA and/or kV according to patient size and/or use of iterative reconstruction technique. COMPARISON:  None. FINDI

## 2021-08-18 DEATH — deceased

## 2021-10-27 ENCOUNTER — Ambulatory Visit: Payer: Medicare Other | Admitting: Internal Medicine

## 2021-11-03 ENCOUNTER — Ambulatory Visit: Payer: Medicare Other | Admitting: Internal Medicine

## 2022-12-11 IMAGING — CT CT HEAD W/O CM
4 series · 15 of 47 positions shown, 17 images · non-contrast
Comparison: None.

CLINICAL DATA: Head trauma



[Series 2: head wo · axial · 0.44mm/px · z∈[-42,+78]mm · 7 of 32 slices shown, 9 images]
[im 4/32  brain]
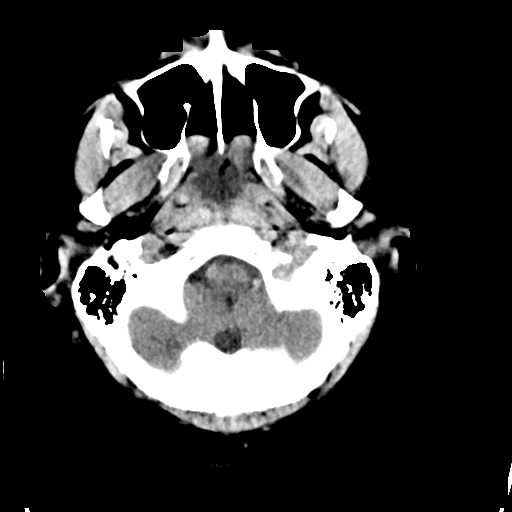
[im 4/32  bone]
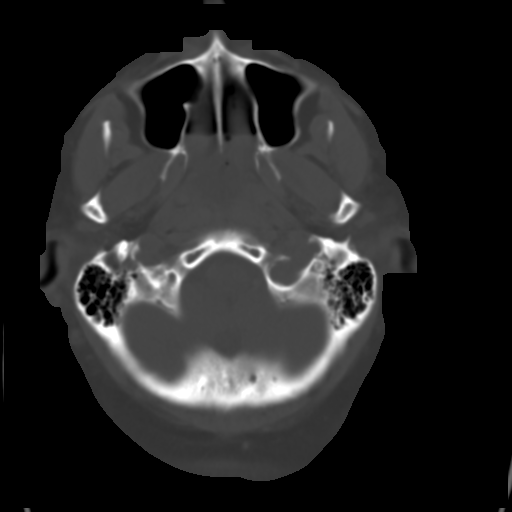
[im 8/32  brain]
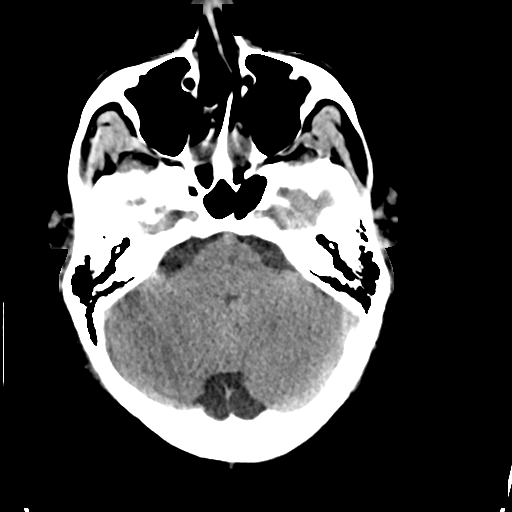
[im 12/32  brain]
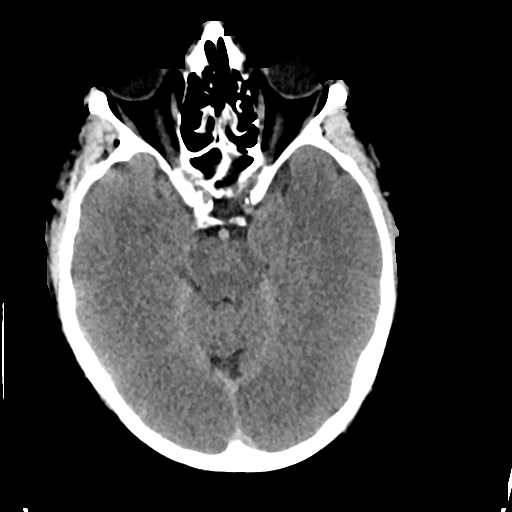
[im 16/32  brain]
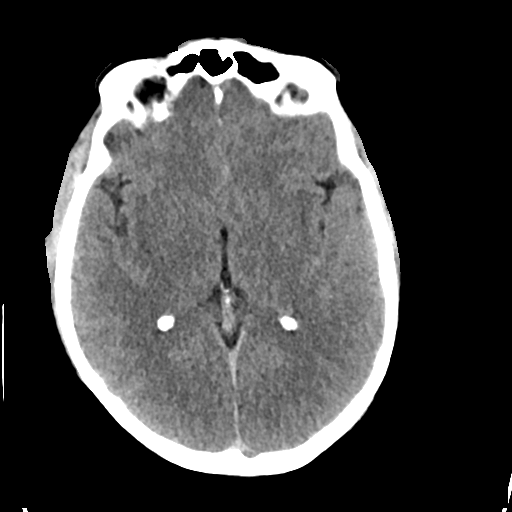
[im 20/32  brain]
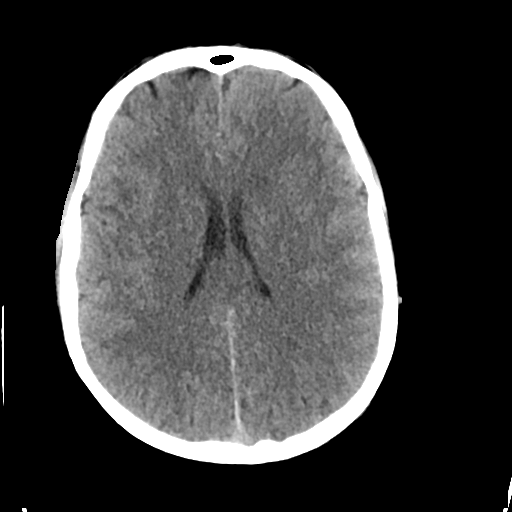
[im 20/32  bone]
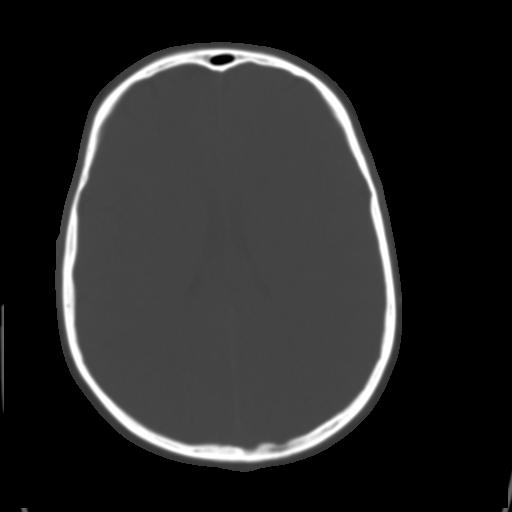
[im 24/32  brain]
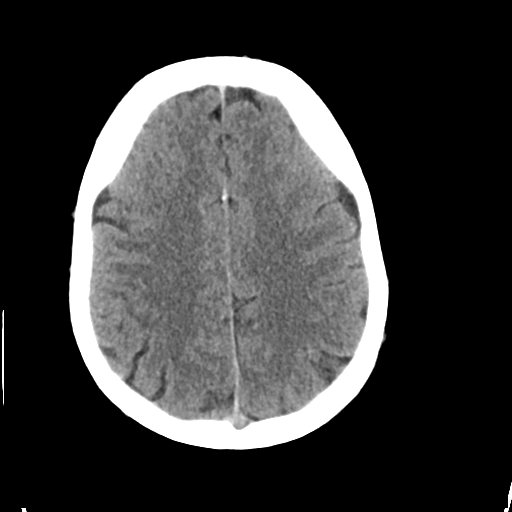
[im 28/32  brain]
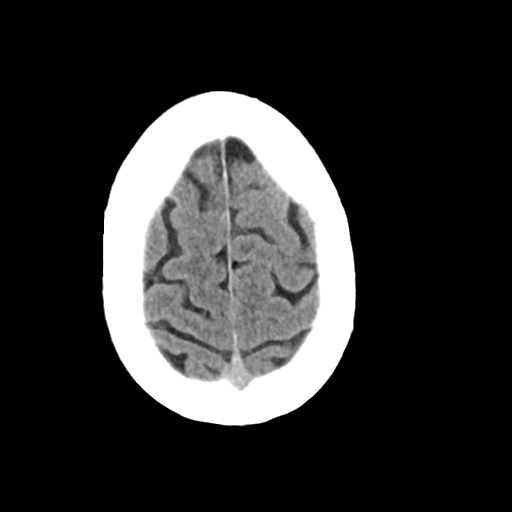

[Series 4: head bone · axial · 0.44mm/px · z∈[-42,-26]mm · 2 of 78 slices shown]
[im 8/78  bone]
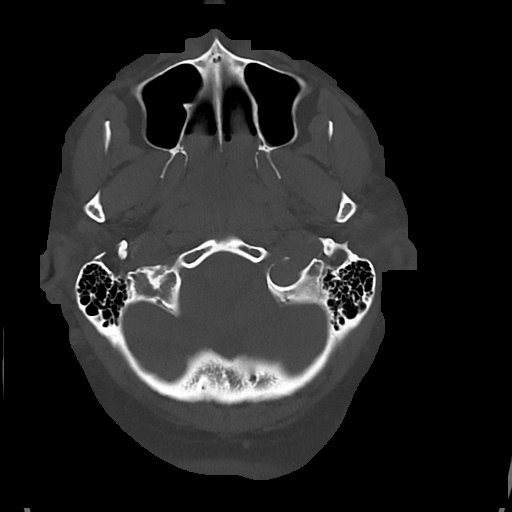
[im 16/78  bone]
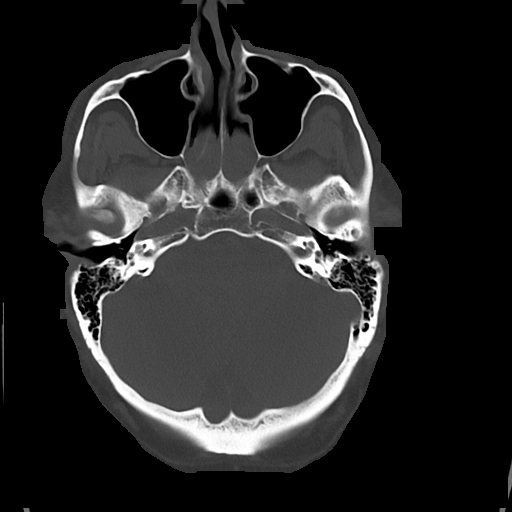

[Series 5: cor soft · coronal · 0.35mm/px · 3 of 76 slices shown]
[im 26/76  brain]
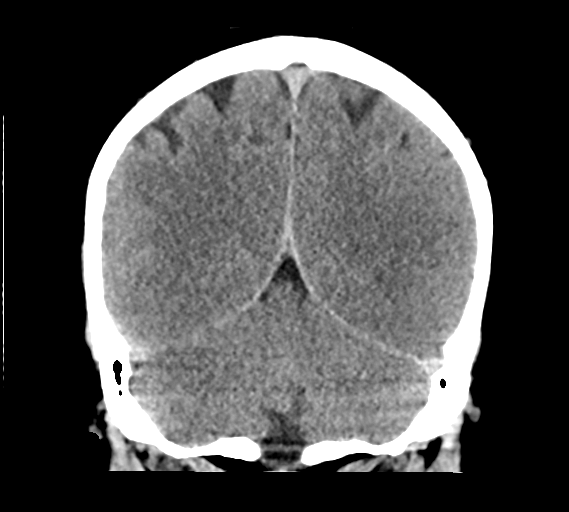
[im 34/76  brain]
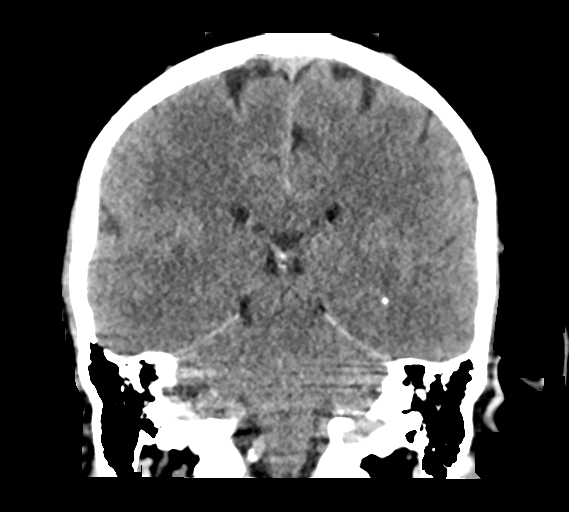
[im 42/76  brain]
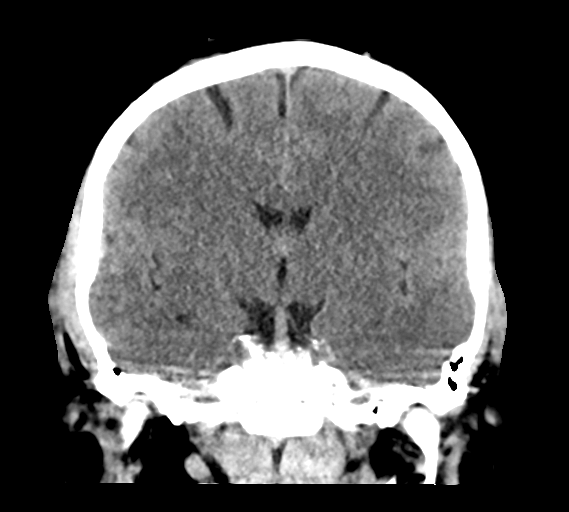

[Series 6: sag soft · sagittal · 0.35mm/px · 3 of 67 slices shown]
[im 23/67  brain]
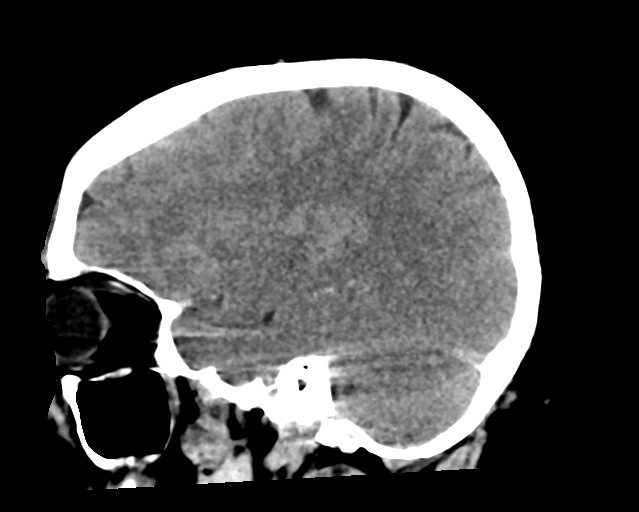
[im 34/67  brain]
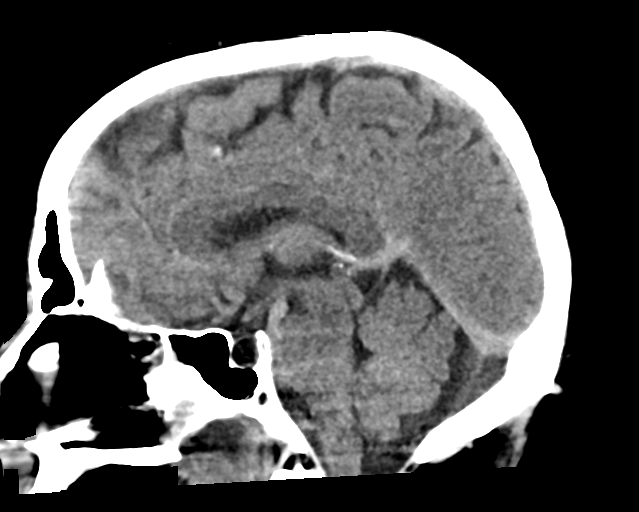
[im 45/67  brain]
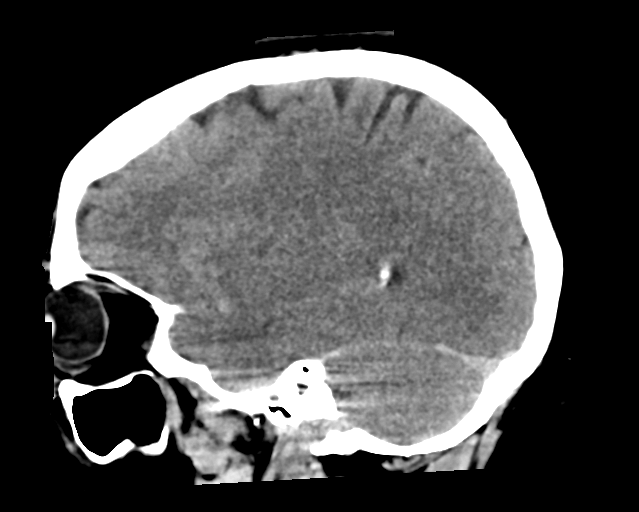

[15 of 47 positions shown; findings below may reference images not displayed]

FINDINGS: CT HEAD FINDINGS

Brain: Ventricles are normal in size and configuration. No
parenchymal or extra-axial hemorrhage.

Poorly delineated structures of the bilateral basal ganglia, RIGHT
greater than LEFT, suspicious for acute obscuring edema.

Vascular: Chronic calcified atherosclerotic changes of the large
vessels at the skull base. No unexpected hyperdense vessel.

Skull: Normal. Negative for fracture or focal lesion.

Sinuses/Orbits: No acute finding.

Other: None.

CT CERVICAL SPINE FINDINGS

Alignment: Mild dextroscoliosis. No evidence of acute vertebral body
subluxation.

Skull base and vertebrae: No fracture line or displaced fracture
fragment identified. Facet joints appear normally aligned
throughout.

Soft tissues and spinal canal: No visible canal hematoma.
Endotracheal tube in place.

Disc levels: Mild degenerative spondylosis of the mid and lower
cervical spine. No more than mild central canal stenosis at any
level.

Upper chest: Endotracheal tube in place. Pneumothorax at the LEFT
lung apex. Pulmonary edema/contusion within the RIGHT upper lung,
incompletely imaged.

Other: Bilateral carotid atherosclerosis.
IMPRESSION: 1. Poorly delineated structures of the bilateral basal ganglia,
RIGHT greater than LEFT, highly suggestive of anoxic ischemic brain
injury with associated edema. Recommend further characterization
with brain MRI.
2. No intracranial hemorrhage.  No skull fracture.
3. No fracture or acute subluxation within the cervical spine.
4. Pneumothorax at the LEFT lung apex. Pulmonary edema/contusion
within the RIGHT upper lung, incompletely imaged. Please see chest
CT angiogram report for definitive characterization.
5. Bilateral carotid atherosclerosis.

Critical Value/emergent results were called by telephone at the time
of interpretation on 08/04/2021 at [DATE] to provider ILYA BALGOBIN
, who verbally acknowledged these results.

## 2022-12-11 IMAGING — CT CT ANGIO CHEST
3 of 7 series · 18 of 36 positions shown · IV contrast (APPLIED)
Comparison: None.

CLINICAL DATA: Status post cardiac arrest and resuscitation. High
clinical probability for pulmonary embolism.

EXAM:
CT ANGIOGRAPHY CHEST WITH CONTRAST
TECHNIQUE: Multidetector CT imaging of the chest was performed using the
standard protocol during bolus administration of intravenous
contrast. Multiplanar CT image reconstructions and MIPs were
obtained to evaluate the vascular anatomy.

[Series 6: lung · axial · 0.98mm/px · z∈[-406,-274]mm · 3 of 166 slices shown]
[im 34/166  mediastinal]
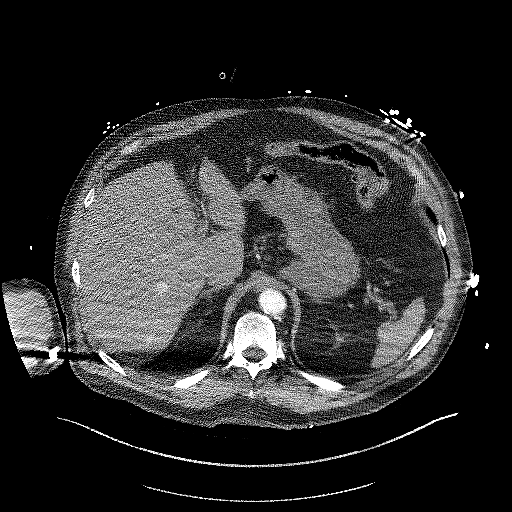
[im 67/166  mediastinal]
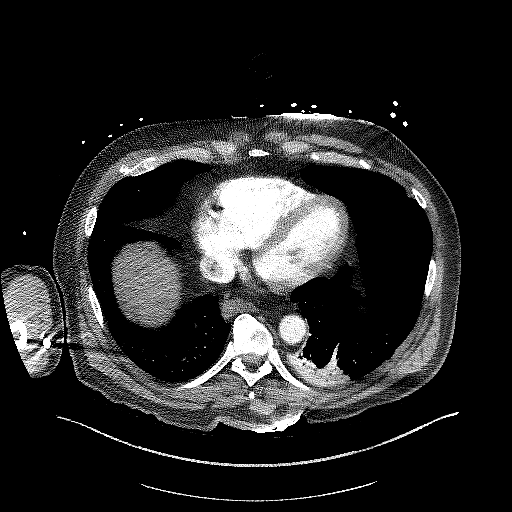
[im 100/166  mediastinal]
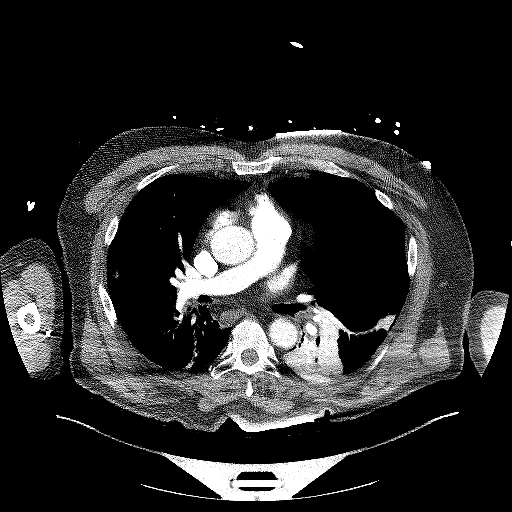

[Series 7: thins · axial · 0.98mm/px · z∈[-450,-164]mm · 14 of 472 slices shown]
[im 32/472  lung]
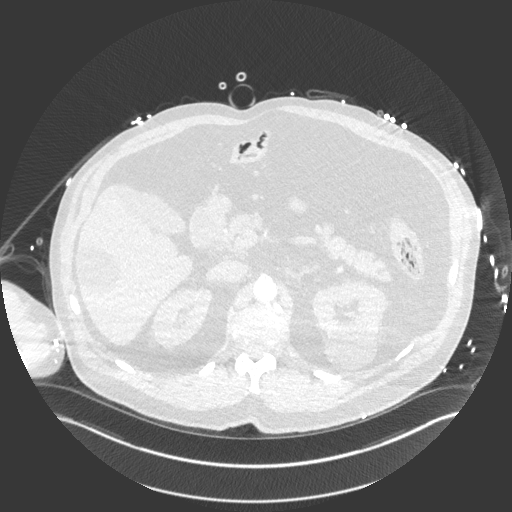
[im 63/472  mediastinal]
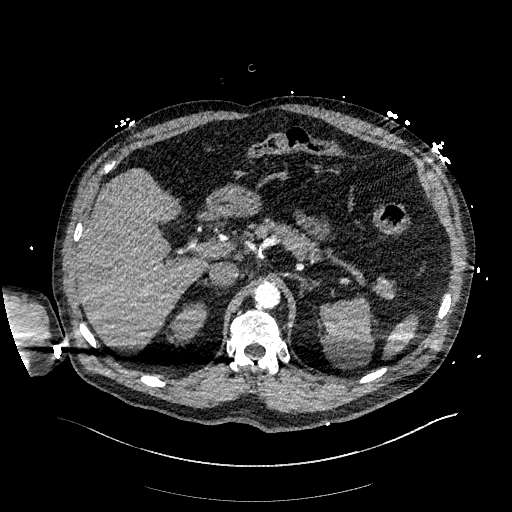
[im 95/472  lung]
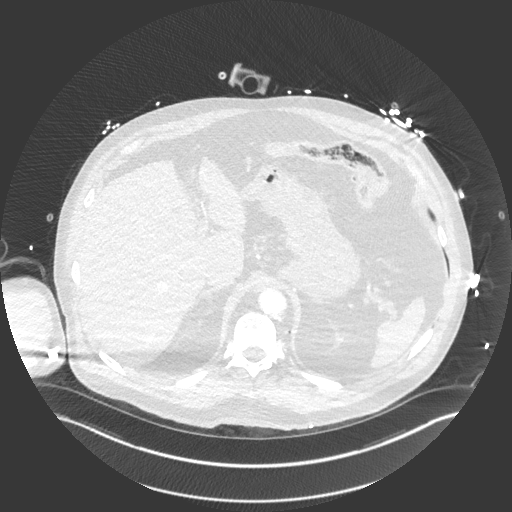
[im 126/472  mediastinal]
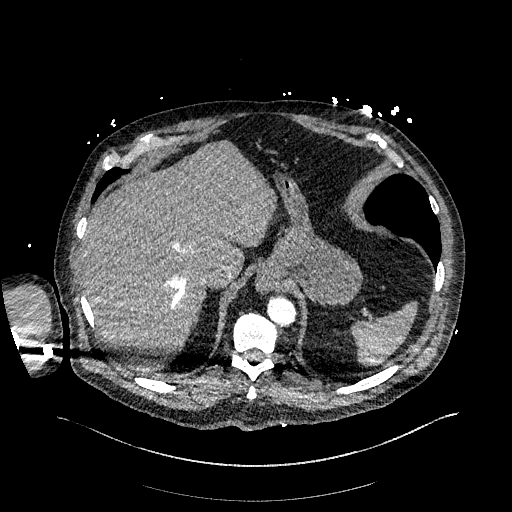
[im 158/472  lung]
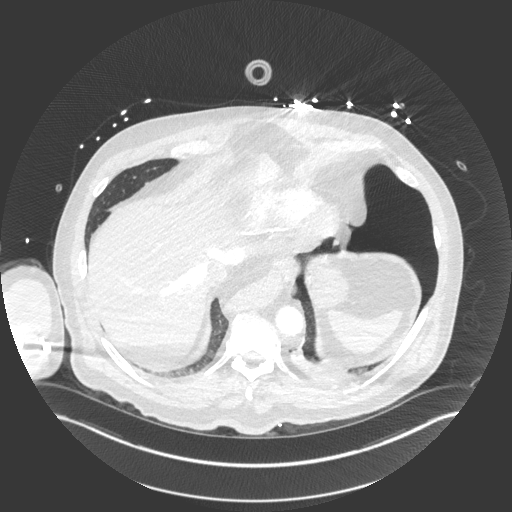
[im 189/472  mediastinal]
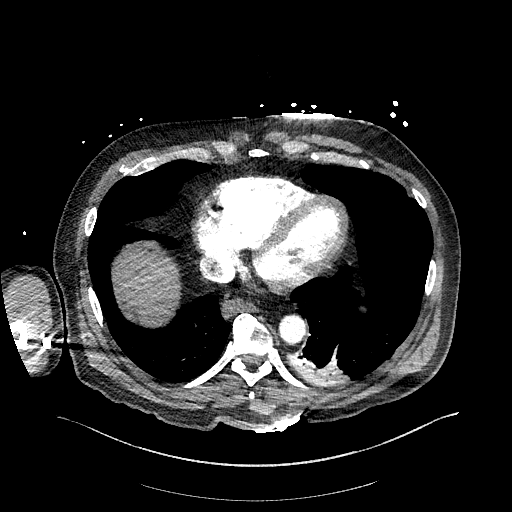
[im 220/472  lung]
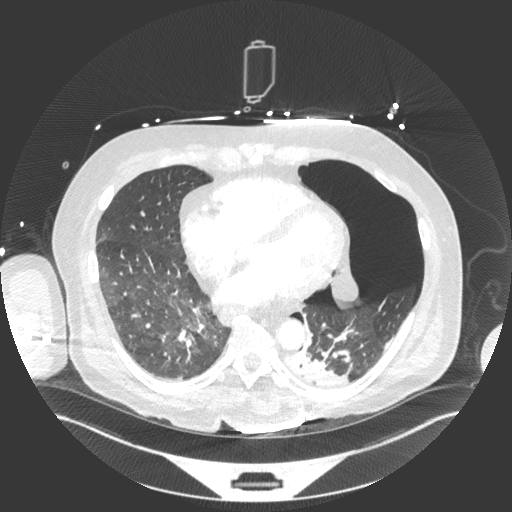
[im 252/472  mediastinal]
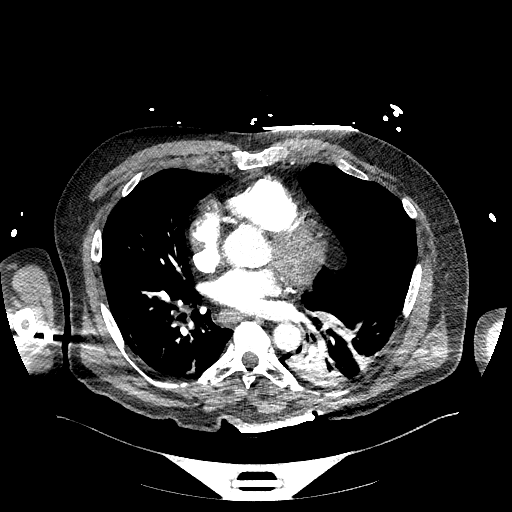
[im 283/472  lung]
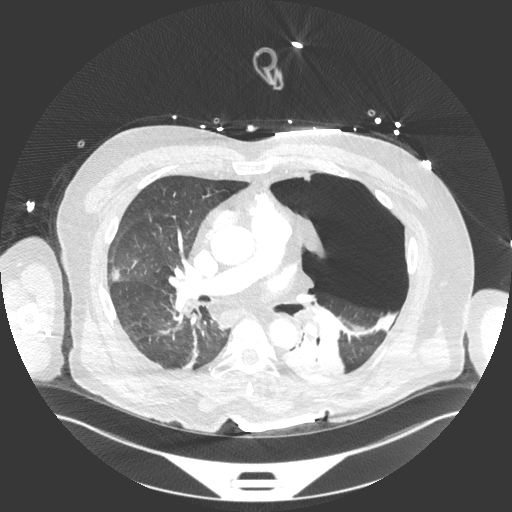
[im 315/472  mediastinal]
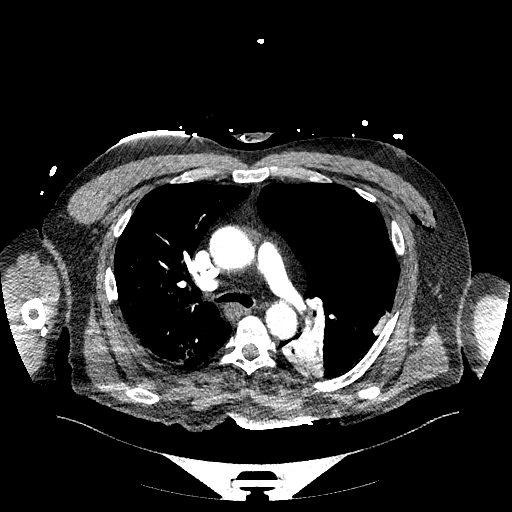
[im 346/472  lung]
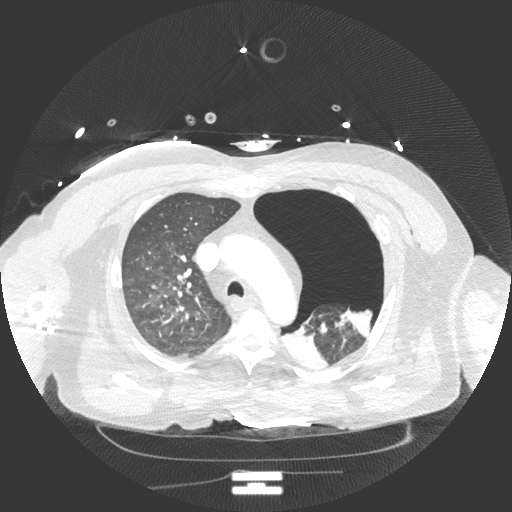
[im 377/472  mediastinal]
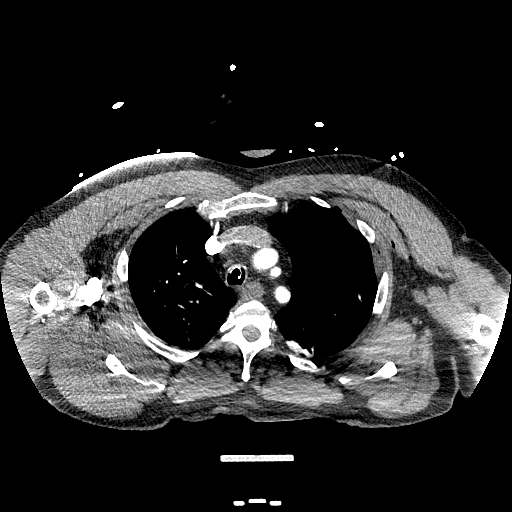
[im 409/472  lung]
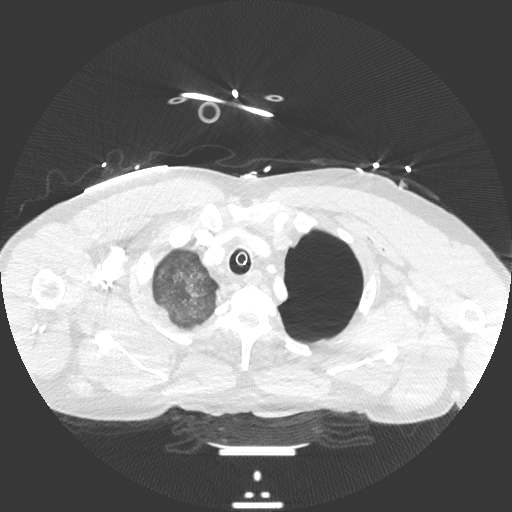
[im 440/472  mediastinal]
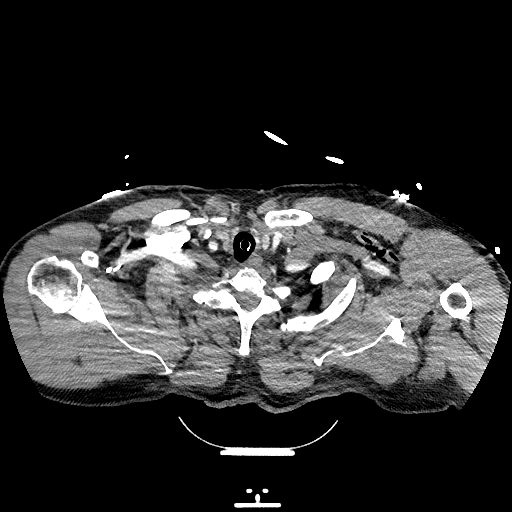

[Series 8: cor · coronal · 0.68mm/px · 1 of 166 slices shown]
[im 83/166  mediastinal]
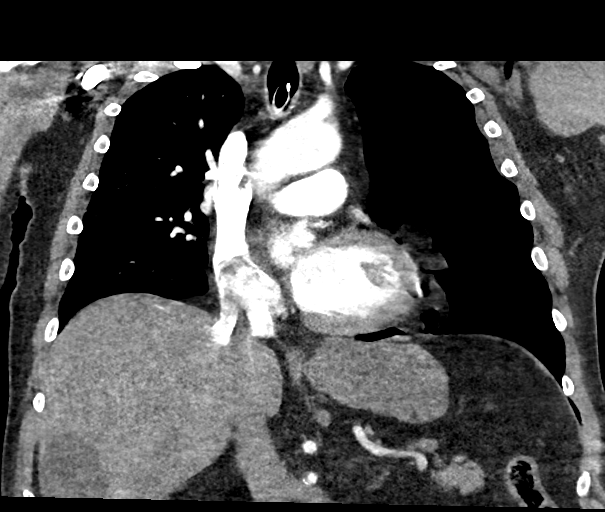

[18 of 36 positions shown; findings below may reference images not displayed]

RADIATION DOSE REDUCTION: This exam was performed according to the
departmental dose-optimization program which includes automated
exposure control, adjustment of the mA and/or kV according to
patient size and/or use of iterative reconstruction technique.

CONTRAST:  50mL OMNIPAQUE IOHEXOL 350 MG/ML SOLN
FINDINGS: Cardiovascular: Satisfactory opacification of pulmonary arteries
noted, and no pulmonary emboli identified. No evidence of thoracic
aortic dissection or aneurysm. Aortic and coronary atherosclerotic
calcification noted.

Mediastinum/Nodes: Endotracheal tube in appropriate position. No
masses or pathologically enlarged lymph nodes identified.

Lungs/Pleura: A large left pneumothorax is seen, with left lung
atelectasis. Patchy areas of airspace disease are seen in the right
upper and middle lobes, with background of ground-glass pulmonary
opacity throughout the right lung. This may be due to pulmonary
edema or aspiration.

Upper abdomen: Small hiatal hernia noted. Hepatic and left renal
cysts also noted.

Musculoskeletal: Fractures of the left anterior 4th through 7th ribs
are seen.

Review of the MIP images confirms the above findings.
IMPRESSION: No evidence of pulmonary embolism.

Large left pneumothorax, with left lung atelectasis.

Fractures of left anterior 4th through 7th ribs.

Patchy areas of airspace disease in right upper and middle lobes,
with background of ground-glass pulmonary opacity throughout the
right lung, suspicious for pulmonary edema or aspiration.

Small hiatal hernia.

Aortic Atherosclerosis ([id number].A).

Critical Value/emergent results were called by telephone at the time
of interpretation on 08/04/2021 at [DATE] to the patient's ED nurse
[place], who verbally acknowledged these results.

## 2022-12-11 IMAGING — CT CT CERVICAL SPINE W/O CM
3 of 4 series · 12 of 35 positions shown, 14 images · non-contrast
Comparison: None.

CLINICAL DATA: Head trauma



[Series 6: sag bone · sagittal · 0.29mm/px · 5 of 113 slices shown, 6 images]
[im 38/113  bone]
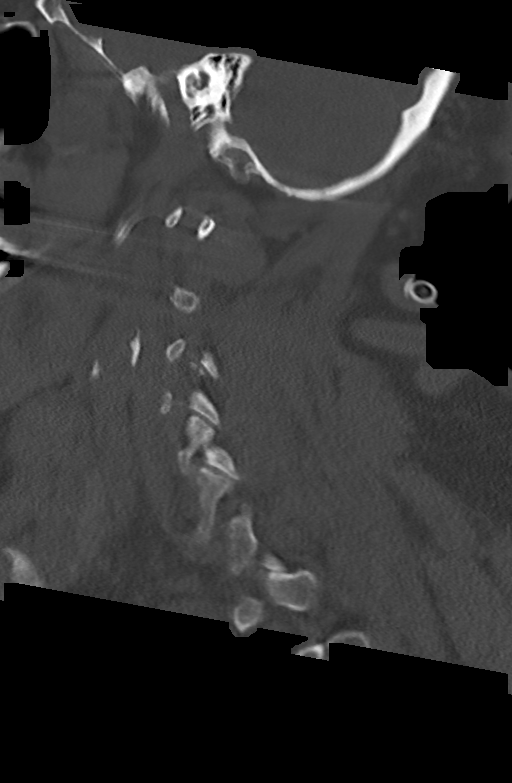
[im 47/113  bone]
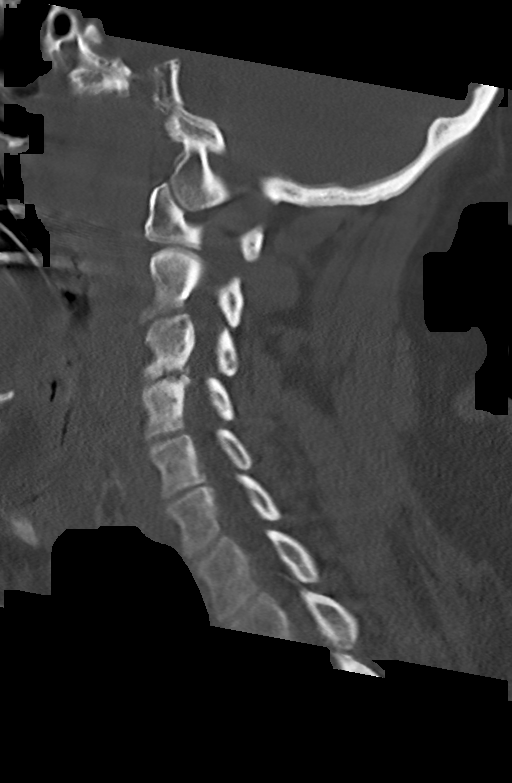
[im 57/113  soft-tissue]
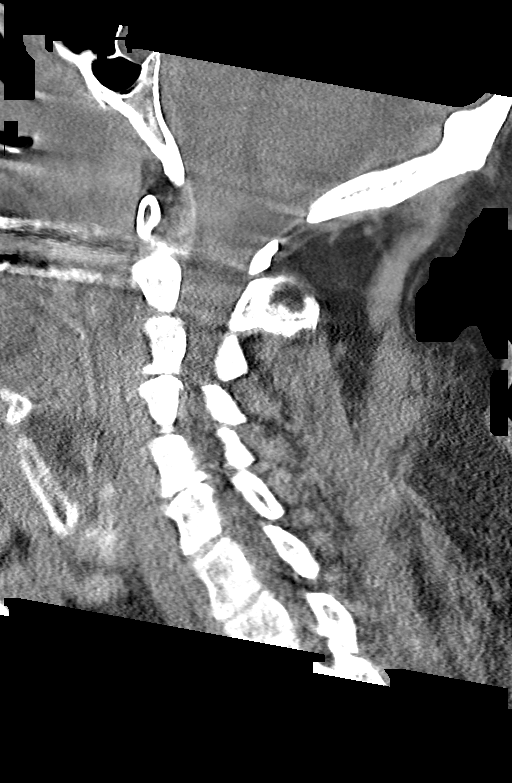
[im 57/113  bone]
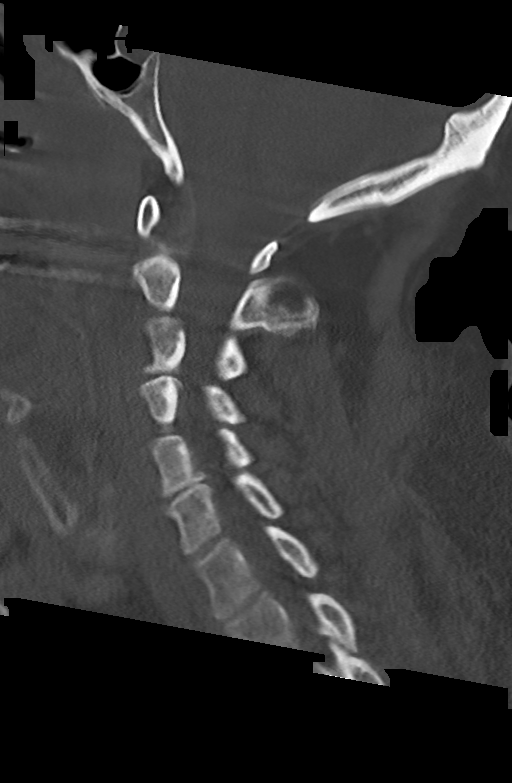
[im 66/113  bone]
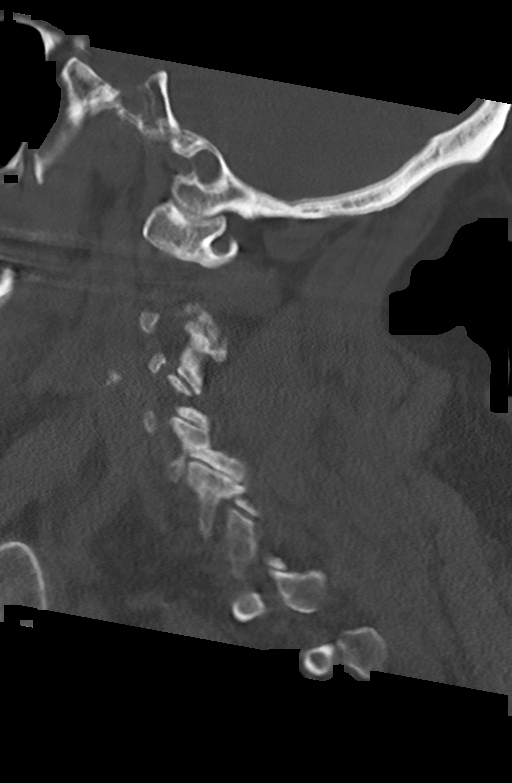
[im 75/113  bone]
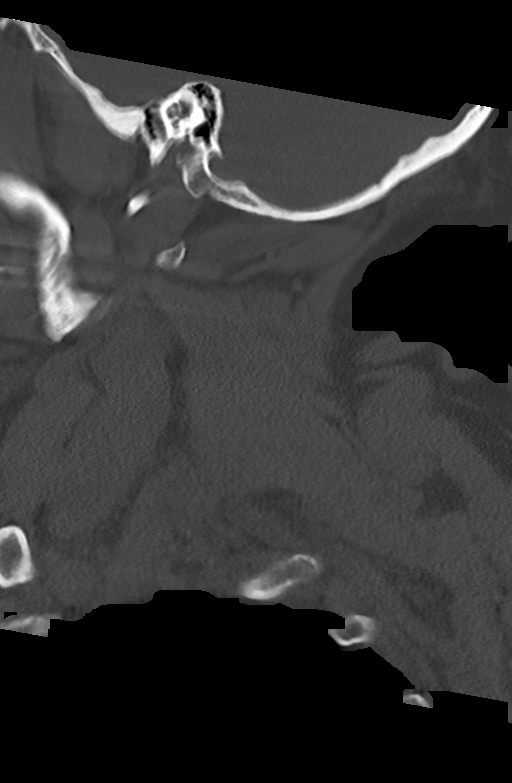

[Series 7: cor bone · coronal · 0.29mm/px · 3 of 63 slices shown]
[im 13/63  bone]
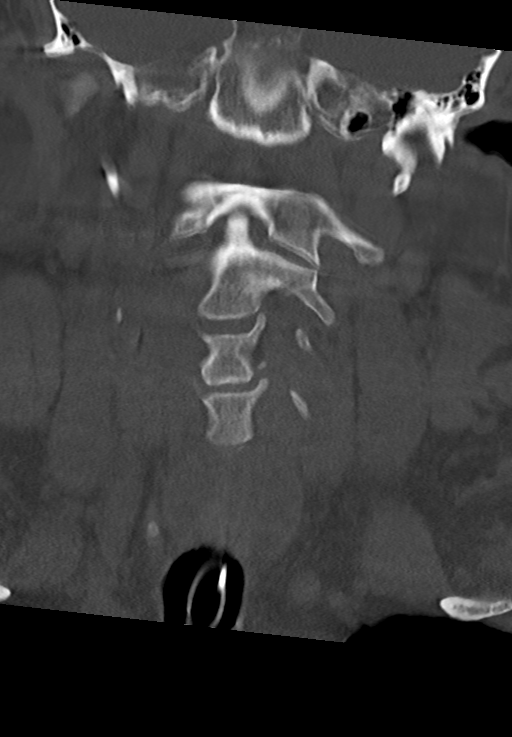
[im 25/63  bone]
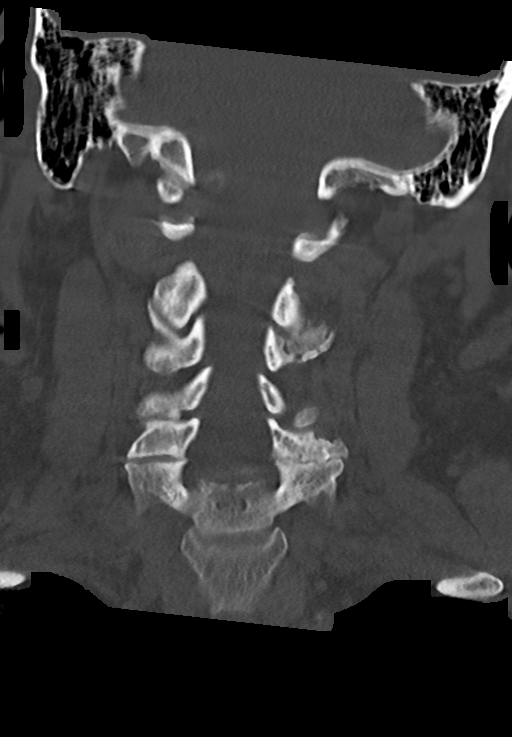
[im 38/63  bone]
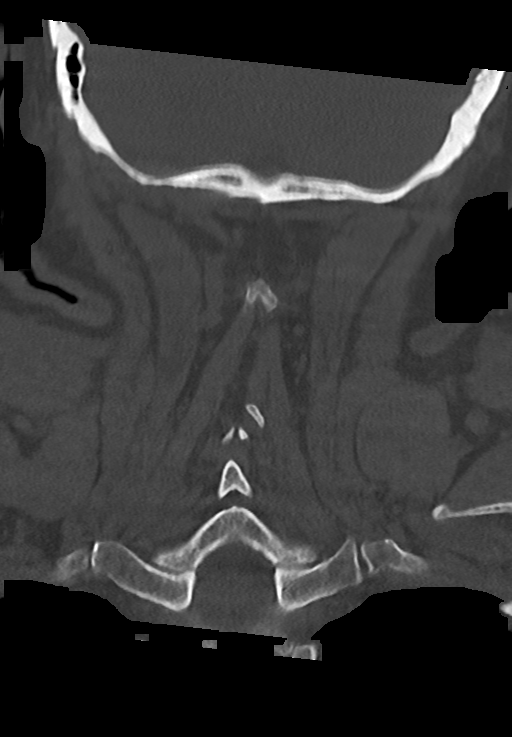

[Series 8: orthogonal axials · axial · 0.28mm/px · z∈[-194,-81]mm · 4 of 89 slices shown, 5 images]
[im 15/89  soft-tissue]
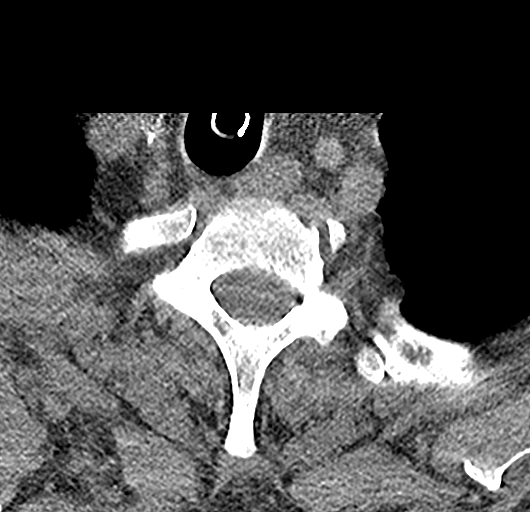
[im 15/89  bone]
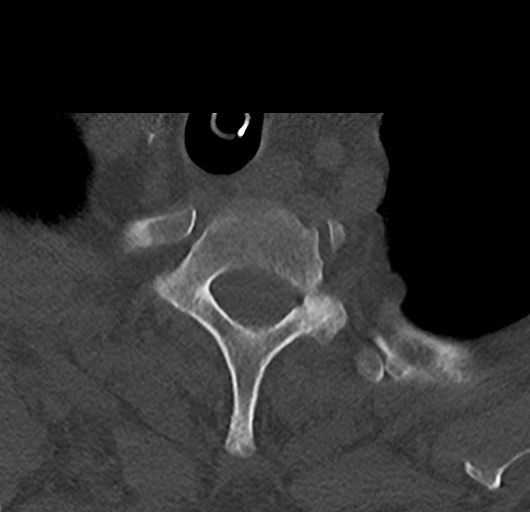
[im 30/89  bone]
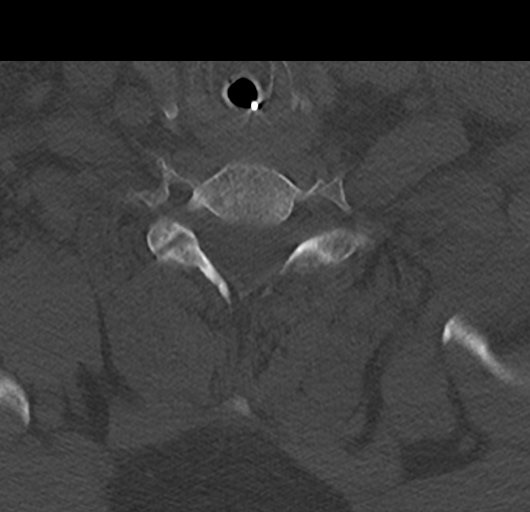
[im 59/89  bone]
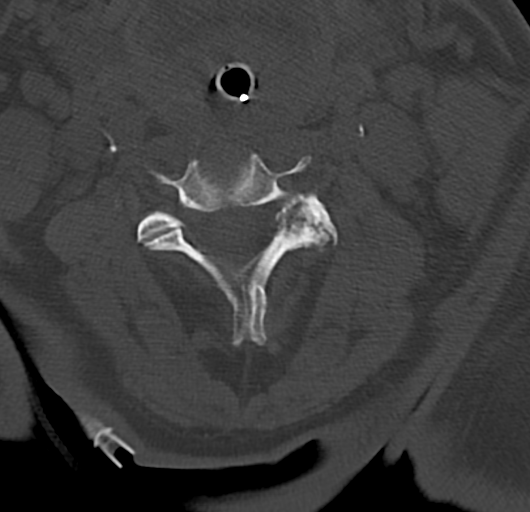
[im 74/89  bone]
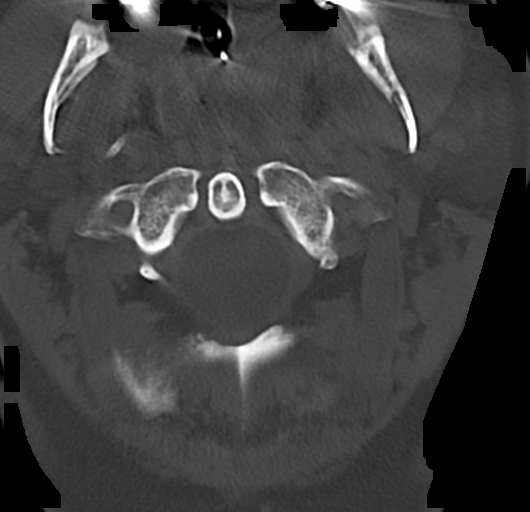

[12 of 35 positions shown; findings below may reference images not displayed]

FINDINGS: CT HEAD FINDINGS

Brain: Ventricles are normal in size and configuration. No
parenchymal or extra-axial hemorrhage.

Poorly delineated structures of the bilateral basal ganglia, RIGHT
greater than LEFT, suspicious for acute obscuring edema.

Vascular: Chronic calcified atherosclerotic changes of the large
vessels at the skull base. No unexpected hyperdense vessel.

Skull: Normal. Negative for fracture or focal lesion.

Sinuses/Orbits: No acute finding.

Other: None.

CT CERVICAL SPINE FINDINGS

Alignment: Mild dextroscoliosis. No evidence of acute vertebral body
subluxation.

Skull base and vertebrae: No fracture line or displaced fracture
fragment identified. Facet joints appear normally aligned
throughout.

Soft tissues and spinal canal: No visible canal hematoma.
Endotracheal tube in place.

Disc levels: Mild degenerative spondylosis of the mid and lower
cervical spine. No more than mild central canal stenosis at any
level.

Upper chest: Endotracheal tube in place. Pneumothorax at the LEFT
lung apex. Pulmonary edema/contusion within the RIGHT upper lung,
incompletely imaged.

Other: Bilateral carotid atherosclerosis.
IMPRESSION: 1. Poorly delineated structures of the bilateral basal ganglia,
RIGHT greater than LEFT, highly suggestive of anoxic ischemic brain
injury with associated edema. Recommend further characterization
with brain MRI.
2. No intracranial hemorrhage.  No skull fracture.
3. No fracture or acute subluxation within the cervical spine.
4. Pneumothorax at the LEFT lung apex. Pulmonary edema/contusion
within the RIGHT upper lung, incompletely imaged. Please see chest
CT angiogram report for definitive characterization.
5. Bilateral carotid atherosclerosis.

Critical Value/emergent results were called by telephone at the time
of interpretation on 08/04/2021 at [DATE] to provider ILYA BALGOBIN
, who verbally acknowledged these results.
